# Patient Record
Sex: Female | Born: 1950
Health system: Southern US, Community
[De-identification: ages and names within clinical notes are randomized; demographics above are authoritative.]

## PROBLEM LIST (undated history)

## (undated) ENCOUNTER — Emergency Department (HOSPITAL_COMMUNITY): Admission: EM | Payer: PPO | Source: Home / Self Care

## (undated) DIAGNOSIS — M1990 Unspecified osteoarthritis, unspecified site: Secondary | ICD-10-CM

## (undated) DIAGNOSIS — H9319 Tinnitus, unspecified ear: Secondary | ICD-10-CM

## (undated) DIAGNOSIS — I341 Nonrheumatic mitral (valve) prolapse: Secondary | ICD-10-CM

## (undated) DIAGNOSIS — C55 Malignant neoplasm of uterus, part unspecified: Secondary | ICD-10-CM

## (undated) DIAGNOSIS — N950 Postmenopausal bleeding: Secondary | ICD-10-CM

## (undated) DIAGNOSIS — J189 Pneumonia, unspecified organism: Secondary | ICD-10-CM

## (undated) DIAGNOSIS — H8110 Benign paroxysmal vertigo, unspecified ear: Secondary | ICD-10-CM

## (undated) DIAGNOSIS — I7300 Raynaud's syndrome without gangrene: Secondary | ICD-10-CM

## (undated) DIAGNOSIS — M419 Scoliosis, unspecified: Secondary | ICD-10-CM

## (undated) HISTORY — PX: OTHER SURGICAL HISTORY: SHX169

## (undated) HISTORY — DX: Postmenopausal bleeding: N95.0

## (undated) HISTORY — PX: DILATION AND CURETTAGE OF UTERUS: SHX78

## (undated) HISTORY — DX: Malignant neoplasm of uterus, part unspecified: C55

## (undated) HISTORY — DX: Scoliosis, unspecified: M41.9

## (undated) HISTORY — DX: Benign paroxysmal vertigo, unspecified ear: H81.10

## (undated) HISTORY — DX: Tinnitus, unspecified ear: H93.19

---
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
## 1997-10-04 ENCOUNTER — Ambulatory Visit (HOSPITAL_COMMUNITY): Admission: RE | Admit: 1997-10-04 | Discharge: 1997-10-04 | Payer: Self-pay | Admitting: Obstetrics & Gynecology

## 1998-11-03 ENCOUNTER — Other Ambulatory Visit: Admission: RE | Admit: 1998-11-03 | Discharge: 1998-11-03 | Payer: Self-pay | Admitting: Obstetrics & Gynecology

## 1998-11-10 ENCOUNTER — Encounter: Payer: Self-pay | Admitting: Obstetrics & Gynecology

## 1998-11-10 ENCOUNTER — Ambulatory Visit (HOSPITAL_COMMUNITY): Admission: RE | Admit: 1998-11-10 | Discharge: 1998-11-10 | Payer: Self-pay | Admitting: Obstetrics & Gynecology

## 1998-11-17 ENCOUNTER — Ambulatory Visit (HOSPITAL_COMMUNITY): Admission: RE | Admit: 1998-11-17 | Discharge: 1998-11-17 | Payer: Self-pay | Admitting: Obstetrics & Gynecology

## 1998-11-17 ENCOUNTER — Encounter: Payer: Self-pay | Admitting: Obstetrics & Gynecology

## 1999-11-20 ENCOUNTER — Encounter: Payer: Self-pay | Admitting: Obstetrics & Gynecology

## 1999-11-20 ENCOUNTER — Ambulatory Visit (HOSPITAL_COMMUNITY): Admission: RE | Admit: 1999-11-20 | Discharge: 1999-11-20 | Payer: Self-pay | Admitting: Obstetrics & Gynecology

## 1999-11-21 ENCOUNTER — Other Ambulatory Visit: Admission: RE | Admit: 1999-11-21 | Discharge: 1999-11-21 | Payer: Self-pay | Admitting: Obstetrics & Gynecology

## 1999-11-28 ENCOUNTER — Encounter: Admission: RE | Admit: 1999-11-28 | Discharge: 1999-11-28 | Payer: Self-pay | Admitting: Obstetrics & Gynecology

## 1999-11-28 ENCOUNTER — Encounter: Payer: Self-pay | Admitting: Obstetrics & Gynecology

## 2001-03-07 ENCOUNTER — Encounter: Payer: Self-pay | Admitting: Obstetrics & Gynecology

## 2001-03-07 ENCOUNTER — Ambulatory Visit (HOSPITAL_COMMUNITY): Admission: RE | Admit: 2001-03-07 | Discharge: 2001-03-07 | Payer: Self-pay | Admitting: Obstetrics & Gynecology

## 2001-05-20 ENCOUNTER — Other Ambulatory Visit: Admission: RE | Admit: 2001-05-20 | Discharge: 2001-05-20 | Payer: Self-pay | Admitting: Obstetrics & Gynecology

## 2002-03-31 ENCOUNTER — Encounter: Payer: Self-pay | Admitting: Obstetrics & Gynecology

## 2002-03-31 ENCOUNTER — Ambulatory Visit (HOSPITAL_COMMUNITY): Admission: RE | Admit: 2002-03-31 | Discharge: 2002-03-31 | Payer: Self-pay | Admitting: Obstetrics & Gynecology

## 2002-07-12 ENCOUNTER — Other Ambulatory Visit: Admission: RE | Admit: 2002-07-12 | Discharge: 2002-07-12 | Payer: Self-pay | Admitting: Obstetrics & Gynecology

## 2003-04-06 ENCOUNTER — Ambulatory Visit (HOSPITAL_COMMUNITY): Admission: RE | Admit: 2003-04-06 | Discharge: 2003-04-06 | Payer: Self-pay | Admitting: Obstetrics & Gynecology

## 2003-04-06 ENCOUNTER — Encounter: Payer: Self-pay | Admitting: Obstetrics & Gynecology

## 2003-09-24 ENCOUNTER — Other Ambulatory Visit: Admission: RE | Admit: 2003-09-24 | Discharge: 2003-09-24 | Payer: Self-pay | Admitting: Obstetrics & Gynecology

## 2004-04-11 ENCOUNTER — Ambulatory Visit (HOSPITAL_COMMUNITY): Admission: RE | Admit: 2004-04-11 | Discharge: 2004-04-11 | Payer: Self-pay | Admitting: Obstetrics & Gynecology

## 2005-02-26 ENCOUNTER — Other Ambulatory Visit: Admission: RE | Admit: 2005-02-26 | Discharge: 2005-02-26 | Payer: Self-pay | Admitting: Obstetrics & Gynecology

## 2005-05-21 ENCOUNTER — Ambulatory Visit (HOSPITAL_COMMUNITY): Admission: RE | Admit: 2005-05-21 | Discharge: 2005-05-21 | Payer: Self-pay | Admitting: Obstetrics & Gynecology

## 2006-05-23 ENCOUNTER — Ambulatory Visit (HOSPITAL_COMMUNITY): Admission: RE | Admit: 2006-05-23 | Discharge: 2006-05-23 | Payer: Self-pay | Admitting: Obstetrics & Gynecology

## 2006-06-13 ENCOUNTER — Encounter: Admission: RE | Admit: 2006-06-13 | Discharge: 2006-06-13 | Payer: Self-pay | Admitting: Obstetrics & Gynecology

## 2007-01-18 ENCOUNTER — Encounter: Admission: RE | Admit: 2007-01-18 | Discharge: 2007-01-18 | Payer: Self-pay | Admitting: Gastroenterology

## 2007-06-19 ENCOUNTER — Encounter: Admission: RE | Admit: 2007-06-19 | Discharge: 2007-06-19 | Payer: Self-pay | Admitting: Obstetrics & Gynecology

## 2008-06-22 ENCOUNTER — Encounter: Admission: RE | Admit: 2008-06-22 | Discharge: 2008-06-22 | Payer: Self-pay | Admitting: Obstetrics & Gynecology

## 2009-06-24 ENCOUNTER — Encounter: Admission: RE | Admit: 2009-06-24 | Discharge: 2009-06-24 | Payer: Self-pay | Admitting: Obstetrics & Gynecology

## 2010-07-11 ENCOUNTER — Encounter: Admission: RE | Admit: 2010-07-11 | Discharge: 2010-07-11 | Payer: Self-pay | Admitting: Obstetrics & Gynecology

## 2010-08-27 ENCOUNTER — Encounter: Payer: Self-pay | Admitting: Obstetrics & Gynecology

## 2011-06-15 ENCOUNTER — Other Ambulatory Visit: Payer: Self-pay | Admitting: Obstetrics & Gynecology

## 2011-06-15 DIAGNOSIS — Z1231 Encounter for screening mammogram for malignant neoplasm of breast: Secondary | ICD-10-CM

## 2011-07-17 ENCOUNTER — Ambulatory Visit
Admission: RE | Admit: 2011-07-17 | Discharge: 2011-07-17 | Disposition: A | Discharge status: Home / Self Care | Payer: Managed Care, Other (non HMO) | Source: Ambulatory Visit | Attending: Obstetrics & Gynecology | Admitting: Obstetrics & Gynecology

## 2011-07-17 DIAGNOSIS — Z1231 Encounter for screening mammogram for malignant neoplasm of breast: Secondary | ICD-10-CM

## 2012-06-16 ENCOUNTER — Other Ambulatory Visit: Payer: Self-pay | Admitting: Obstetrics & Gynecology

## 2012-06-16 DIAGNOSIS — Z1231 Encounter for screening mammogram for malignant neoplasm of breast: Secondary | ICD-10-CM

## 2012-07-17 ENCOUNTER — Ambulatory Visit: Payer: Managed Care, Other (non HMO)

## 2012-08-07 ENCOUNTER — Ambulatory Visit: Payer: Managed Care, Other (non HMO)

## 2012-08-25 ENCOUNTER — Ambulatory Visit
Admission: RE | Admit: 2012-08-25 | Discharge: 2012-08-25 | Disposition: A | Discharge status: Home / Self Care | Payer: Managed Care, Other (non HMO) | Source: Ambulatory Visit | Attending: Obstetrics & Gynecology | Admitting: Obstetrics & Gynecology

## 2012-08-25 DIAGNOSIS — Z1231 Encounter for screening mammogram for malignant neoplasm of breast: Secondary | ICD-10-CM

## 2013-07-24 ENCOUNTER — Other Ambulatory Visit: Payer: Self-pay

## 2013-07-24 DIAGNOSIS — Z1231 Encounter for screening mammogram for malignant neoplasm of breast: Secondary | ICD-10-CM

## 2013-08-26 ENCOUNTER — Ambulatory Visit: Payer: Managed Care, Other (non HMO)

## 2013-09-16 ENCOUNTER — Ambulatory Visit
Admission: RE | Admit: 2013-09-16 | Discharge: 2013-09-16 | Disposition: A | Discharge status: Home / Self Care | Payer: Managed Care, Other (non HMO) | Source: Ambulatory Visit

## 2013-09-16 DIAGNOSIS — Z1231 Encounter for screening mammogram for malignant neoplasm of breast: Secondary | ICD-10-CM

## 2014-08-30 ENCOUNTER — Other Ambulatory Visit: Payer: Self-pay

## 2014-08-30 DIAGNOSIS — Z1231 Encounter for screening mammogram for malignant neoplasm of breast: Secondary | ICD-10-CM

## 2014-09-17 ENCOUNTER — Ambulatory Visit
Admission: RE | Admit: 2014-09-17 | Discharge: 2014-09-17 | Disposition: A | Discharge status: Home / Self Care | Payer: Managed Care, Other (non HMO) | Source: Ambulatory Visit

## 2014-09-17 DIAGNOSIS — Z1231 Encounter for screening mammogram for malignant neoplasm of breast: Secondary | ICD-10-CM

## 2015-08-23 ENCOUNTER — Other Ambulatory Visit: Payer: Self-pay

## 2015-08-23 DIAGNOSIS — Z1231 Encounter for screening mammogram for malignant neoplasm of breast: Secondary | ICD-10-CM

## 2015-09-19 ENCOUNTER — Ambulatory Visit
Admission: RE | Admit: 2015-09-19 | Discharge: 2015-09-19 | Disposition: A | Discharge status: Home / Self Care | Payer: Managed Care, Other (non HMO) | Source: Ambulatory Visit

## 2015-09-19 DIAGNOSIS — Z1231 Encounter for screening mammogram for malignant neoplasm of breast: Secondary | ICD-10-CM

## 2016-08-20 ENCOUNTER — Other Ambulatory Visit: Payer: Self-pay | Admitting: Obstetrics & Gynecology

## 2016-08-20 DIAGNOSIS — Z1231 Encounter for screening mammogram for malignant neoplasm of breast: Secondary | ICD-10-CM

## 2016-09-21 ENCOUNTER — Ambulatory Visit: Payer: Managed Care, Other (non HMO)

## 2016-10-03 ENCOUNTER — Ambulatory Visit
Admission: RE | Admit: 2016-10-03 | Discharge: 2016-10-03 | Disposition: A | Discharge status: Home / Self Care | Payer: BLUE CROSS/BLUE SHIELD | Source: Ambulatory Visit | Attending: Obstetrics & Gynecology | Admitting: Obstetrics & Gynecology

## 2016-10-03 DIAGNOSIS — Z1231 Encounter for screening mammogram for malignant neoplasm of breast: Secondary | ICD-10-CM

## 2017-07-10 ENCOUNTER — Telehealth: Payer: Self-pay | Admitting: *Deleted

## 2017-07-10 NOTE — Telephone Encounter (Signed)
Called and spoke with the patient, gave the new patient appt for December 12th at 10:45am

## 2017-07-17 ENCOUNTER — Ambulatory Visit: Payer: BLUE CROSS/BLUE SHIELD | Attending: Gynecologic Oncology | Admitting: Gynecologic Oncology

## 2017-07-17 ENCOUNTER — Encounter: Payer: Self-pay | Admitting: Gynecologic Oncology

## 2017-07-17 VITALS — BP 136/64 | HR 74 | Temp 97.4°F | Resp 18 | Ht 64.5 in | Wt 134.2 lb

## 2017-07-17 DIAGNOSIS — C541 Malignant neoplasm of endometrium: Secondary | ICD-10-CM | POA: Insufficient documentation

## 2017-07-17 DIAGNOSIS — Z981 Arthrodesis status: Secondary | ICD-10-CM | POA: Diagnosis not present

## 2017-07-17 DIAGNOSIS — E559 Vitamin D deficiency, unspecified: Secondary | ICD-10-CM | POA: Insufficient documentation

## 2017-07-17 DIAGNOSIS — M419 Scoliosis, unspecified: Secondary | ICD-10-CM | POA: Diagnosis not present
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
## 2017-07-17 NOTE — Patient Instructions (Signed)
Preparing for your Surgery  Plan for surgery on August 15, 2017 with Dr. Janie Morning at [place] will be scheduled for a robotic assisted total hysterectomy, bilateral salpingo-oophorectomy, sentinel lymph node biopsy.  Stop taking your hormones (estrogen patch, progesterone) now.  Pre-operative Testing -You will receive a phone call from presurgical testing at [facility] to arrange for a pre-operative testing appointment before your surgery.  This appointment normally occurs one to two weeks before your scheduled surgery.   -Bring your insurance card, copy of an advanced directive if applicable, medication list  -At that visit, you will be asked to sign a consent for a possible blood transfusion in case a transfusion becomes necessary during surgery.  The need for a blood transfusion is rare but having consent is a necessary part of your care.    -You should not be taking blood thinners or aspirin at least ten days prior to surgery unless instructed by your surgeon.  Day Before Surgery at [place] will be asked to take in a light diet the day before surgery.  Avoid carbonated beverages.  You will be advised to have nothing to eat or drink after midnight the evening before.    Eat a light diet the day before surgery.  Examples including soups, broths, toast, yogurt, mashed potatoes.  Things to avoid include carbonated beverages (fizzy beverages), raw fruits and raw vegetables, or beans.   If your bowels are filled with gas, your surgeon will have difficulty visualizing your pelvic organs which increases your surgical risks.  Your role in recovery Your role is to become active as soon as directed by your doctor, while still giving yourself time to heal.  Rest when you feel tired. You will be asked to do the following in order to speed your recovery:  - Cough and breathe deeply. This helps toclear and expand your lungs and can prevent pneumonia.  You may be given a spirometer to practice deep breathing. A staff member will show you how to use the spirometer. - Do mild physical activity. Walking or moving your legs help your circulation and body functions return to normal. A staff member will help you when you try to walk and will provide you with simple exercises. Do not try to get up or walk alone the first time. - Actively manage your pain. Managing your pain lets you move in comfort. We will ask you to rate your pain on a scale of zero to 10. It is your responsibility to tell your doctor or nurse where and how much you hurt so your pain can be treated.  Special Considerations -If you are diabetic, you may be placed on insulin after surgery to have closer control over your blood sugars to promote healing and recovery.  This does not mean that you will be discharged on insulin.  If applicable, your oral antidiabetics will be resumed when you are tolerating a solid diet.  -Your final pathology results from surgery should be available by the Friday after surgery and the results will be relayed to you when available.  -Dr. Lahoma Crocker is the Surgeon that assists your GYN Oncologist with surgery.  The next day after your surgery you will either see your GYN Oncologist or Dr. Lahoma Crocker.   Blood Transfusion Information WHAT IS A BLOOD TRANSFUSION? A transfusion is the replacement of blood or some of its parts. Blood is made up of multiple cells which provide different functions.  Red blood  cells carry oxygen and are used for blood loss replacement.  White blood cells fight against infection.  Platelets control bleeding.  Plasma helps clot blood.  Other blood products are available for specialized needs, such as hemophilia or other clotting disorders. BEFORE THE TRANSFUSION  Who gives blood for transfusions?   You may be able to donate blood to be used at a later date on yourself (autologous donation).  Relatives can  be asked to donate blood. This is generally not any safer than if you have received blood from a stranger. The same precautions are taken to ensure safety when a relative's blood is donated.  Healthy volunteers who are fully evaluated to make sure their blood is safe. This is blood bank blood. Transfusion therapy is the safest it has ever been in the practice of medicine. Before blood is taken from a donor, a complete history is taken to make sure that person has no history of diseases nor engages in risky social behavior (examples are intravenous drug use or sexual activity with multiple partners). The donor's travel history is screened to minimize risk of transmitting infections, such as malaria. The donated blood is tested for signs of infectious diseases, such as HIV and hepatitis. The blood is then tested to be sure it is compatible with you in order to minimize the chance of a transfusion reaction. If you or a relative donates blood, this is often done in anticipation of surgery and is not appropriate for emergency situations. It takes many days to process the donated blood. RISKS AND COMPLICATIONS Although transfusion therapy is very safe and saves many lives, the main dangers of transfusion include:   Getting an infectious disease.  Developing a transfusion reaction. This is an allergic reaction to something in the blood you were given. Every precaution is taken to prevent this. The decision to have a blood transfusion has been considered carefully by your caregiver before blood is given. Blood is not given unless the benefits outweigh the risks.

## 2017-07-19 ENCOUNTER — Telehealth: Payer: Self-pay | Admitting: Gynecologic Oncology

## 2017-07-19 NOTE — Telephone Encounter (Signed)
Returned call to patient.  Patient stated she spoke with her GYN who recommended she have surgery with Dr. Alycia Rossetti or Dr. Denman George.  She wants to be removed from Jan 10 with Dr. Skeet Latch.  Informed patient of options for Jan 8 at [facility] with Dr. Alycia Rossetti or Jan 22 with Dr. Denman George in [place].  She is going to discuss with her husband and call the office back with her decision.

## 2017-07-22 ENCOUNTER — Telehealth: Payer: Self-pay | Admitting: Gynecologic Oncology

## 2017-07-22 NOTE — Telephone Encounter (Signed)
Returned call to patient.  Patient stating she would like to proceed with surgery with Dr. Denman George on Jan 22.  All questions answered.  Advised to call for any needs.

## 2017-08-08 ENCOUNTER — Telehealth: Payer: Self-pay | Admitting: Gynecologic Oncology

## 2017-08-08 NOTE — Telephone Encounter (Signed)
Returned call to patient.  Patient stating she has been having loose stools in the am and she feels it is related to her nerves.  Advised to follow up with her primary care about this issue in case a sample needs to be given for further testing.  All questions answered related to upcoming surgery.  Advised patient that she could make an appt to meet with Dr. Denman George in the office prior to surgery if she would like and she said she would contact the office if she would like to do that.  Advised to call for any questions or concerns.

## 2017-08-14 ENCOUNTER — Telehealth: Payer: Self-pay | Admitting: Gynecologic Oncology

## 2017-08-14 NOTE — Telephone Encounter (Signed)
Returned call to patient.  All questions answered relating to surgery including sentinel lymph node biopsy and possible lymphadenectomy.  Advised to call for any questions or concerns.

## 2017-08-16 NOTE — Patient Instructions (Signed)
Stephanie Mahoney  08/16/2017   Your procedure is scheduled on: 08/27/17   Report to [facility] Main  Entrance  Report to admitting at   0900 AM   Call this number if you have problems the morning of surgery [phone]   Remember: Do not eat food or drink liquids :After Midnight. Eat a light diet the day before surgery.  Examples include: soups, toast, broth, yogurt and mashed potatoes.  Things to avoid include carbonated beverages, raw fruits and vegetables and beans.      Take these medicines the morning of surgery with A SIP OF WATER: none                                 You may not have any metal on your body including hair pins and              piercings  Do not wear jewelry, make-up, lotions, powders or perfumes, deodorant             Do not wear nail polish.  Do not shave  48 hours prior to surgery.                Do not bring valuables to the hospital. [place].  Contacts, dentures or bridgework may not be worn into surgery.  Leave suitcase in the car. After surgery it may be brought to your room.     Patients discharged the day of surgery will not be allowed to drive home.  Name and phone number of your driver:  Special Instructions: coughing and deep breathing exercises, leg exercises               Please read over the following fact sheets you were given: _____________________________________________________________________             [facility] - Preparing for Surgery Before surgery, you can play an important role.  Because skin is not sterile, your skin needs to be as free of germs as possible.  You can reduce the number of germs on your skin by washing with CHG (chlorahexidine gluconate) soap before surgery.  CHG is an antiseptic cleaner which kills germs and bonds with the skin to continue killing germs even after washing. Please DO NOT use if you have an allergy to CHG or antibacterial  soaps.  If your skin becomes reddened/irritated stop using the CHG and inform your nurse when you arrive at Short Stay. Do not shave (including legs and underarms) for at least 48 hours prior to the first CHG shower.  You may shave your face/neck. Please follow these instructions carefully:  1.  Shower with CHG Soap the night before surgery and the  morning of Surgery.  2.  If you choose to wash your hair, wash your hair first as usual with your  normal  shampoo.  3.  After you shampoo, rinse your hair and body thoroughly to remove the  shampoo.                           4.  Use CHG as you would any other liquid soap.  You can apply chg directly  to the skin and wash  Gently with a scrungie or clean washcloth.  5.  Apply the CHG Soap to your body ONLY FROM THE NECK DOWN.   Do not use on face/ open                           Wound or open sores. Avoid contact with eyes, ears mouth and genitals (private parts).                       Wash face,  Genitals (private parts) with your normal soap.             6.  Wash thoroughly, paying special attention to the area where your surgery  will be performed.  7.  Thoroughly rinse your body with warm water from the neck down.  8.  DO NOT shower/wash with your normal soap after using and rinsing off  the CHG Soap.                9.  Pat yourself dry with a clean towel.            10.  Wear clean pajamas.            11.  Place clean sheets on your bed the night of your first shower and do not  sleep with pets. Day of Surgery : Do not apply any lotions/deodorants the morning of surgery.  Please wear clean clothes to the hospital/surgery center.  FAILURE TO FOLLOW THESE INSTRUCTIONS MAY RESULT IN THE CANCELLATION OF YOUR SURGERY PATIENT SIGNATURE_________________________________  NURSE SIGNATURE__________________________________  ________________________________________________________________________  WHAT IS A BLOOD TRANSFUSION? Blood  Transfusion Information  A transfusion is the replacement of blood or some of its parts. Blood is made up of multiple cells which provide different functions.  Red blood cells carry oxygen and are used for blood loss replacement.  White blood cells fight against infection.  Platelets control bleeding.  Plasma helps clot blood.  Other blood products are available for specialized needs, such as hemophilia or other clotting disorders. BEFORE THE TRANSFUSION  Who gives blood for transfusions?   Healthy volunteers who are fully evaluated to make sure their blood is safe. This is blood bank blood. Transfusion therapy is the safest it has ever been in the practice of medicine. Before blood is taken from a donor, a complete history is taken to make sure that person has no history of diseases nor engages in risky social behavior (examples are intravenous drug use or sexual activity with multiple partners). The donor's travel history is screened to minimize risk of transmitting infections, such as malaria. The donated blood is tested for signs of infectious diseases, such as HIV and hepatitis. The blood is then tested to be sure it is compatible with you in order to minimize the chance of a transfusion reaction. If you or a relative donates blood, this is often done in anticipation of surgery and is not appropriate for emergency situations. It takes many days to process the donated blood. RISKS AND COMPLICATIONS Although transfusion therapy is very safe and saves many lives, the main dangers of transfusion include:   Getting an infectious disease.  Developing a transfusion reaction. This is an allergic reaction to something in the blood you were given. Every precaution is taken to prevent this. The decision to have a blood transfusion has been considered carefully by your caregiver before blood is given. Blood is not given unless the benefits outweigh  the risks. AFTER THE TRANSFUSION  Right after  receiving a blood transfusion, you will usually feel much better and more energetic. This is especially true if your red blood cells have gotten low (anemic). The transfusion raises the level of the red blood cells which carry oxygen, and this usually causes an energy increase.  The nurse administering the transfusion will monitor you carefully for complications. HOME CARE INSTRUCTIONS  No special instructions are needed after a transfusion. You may find your energy is better. Speak with your caregiver about any limitations on activity for underlying diseases you may have. SEEK MEDICAL CARE IF:   Your condition is not improving after your transfusion.  You develop redness or irritation at the intravenous (IV) site. SEEK IMMEDIATE MEDICAL CARE IF:  Any of the following symptoms occur over the next 12 hours:  Shaking chills.  You have a temperature by mouth above 102 F (38.9 C), not controlled by medicine.  Chest, back, or muscle pain.  People around you feel you are not acting correctly or are confused.  Shortness of breath or difficulty breathing.  Dizziness and fainting.  You get a rash or develop hives.  You have a decrease in urine output.  Your urine turns a dark color or changes to pink, red, or brown. Any of the following symptoms occur over the next 10 days:  You have a temperature by mouth above 102 F (38.9 C), not controlled by medicine.  Shortness of breath.  Weakness after normal activity.  The white part of the eye turns yellow (jaundice).  You have a decrease in the amount of urine or are urinating less often.  Your urine turns a dark color or changes to pink, red, or brown. Document Released: 07/20/2000 Document Revised: 10/15/2011 Document Reviewed: 03/08/2008 ExitCare Patient Information 2014 [place].  _______________________________________________________________________  Incentive Spirometer  An incentive spirometer is a tool that can  help keep your lungs clear and active. This tool measures how well you are filling your lungs with each breath. Taking long deep breaths may help reverse or decrease the chance of developing breathing (pulmonary) problems (especially infection) following:  A long period of time when you are unable to move or be active. BEFORE THE PROCEDURE   If the spirometer includes an indicator to show your best effort, your nurse or respiratory therapist will set it to a desired goal.  If possible, sit up straight or lean slightly forward. Try not to slouch.  Hold the incentive spirometer in an upright position. INSTRUCTIONS FOR USE  1. Sit on the edge of your bed if possible, or sit up as far as you can in bed or on a chair. 2. Hold the incentive spirometer in an upright position. 3. Breathe out normally. 4. Place the mouthpiece in your mouth and seal your lips tightly around it. 5. Breathe in slowly and as deeply as possible, raising the piston or the ball toward the top of the column. 6. Hold your breath for 3-5 seconds or for as long as possible. Allow the piston or ball to fall to the bottom of the column. 7. Remove the mouthpiece from your mouth and breathe out normally. 8. Rest for a few seconds and repeat Steps 1 through 7 at least 10 times every 1-2 hours when you are awake. Take your time and take a few normal breaths between deep breaths. 9. The spirometer may include an indicator to show your best effort. Use the indicator as a goal to work  toward during each repetition. 10. After each set of 10 deep breaths, practice coughing to be sure your lungs are clear. If you have an incision (the cut made at the time of surgery), support your incision when coughing by placing a pillow or rolled up towels firmly against it. Once you are able to get out of bed, walk around indoors and cough well. You may stop using the incentive spirometer when instructed by your caregiver.  RISKS AND COMPLICATIONS  Take  your time so you do not get dizzy or light-headed.  If you are in pain, you may need to take or ask for pain medication before doing incentive spirometry. It is harder to take a deep breath if you are having pain. AFTER USE  Rest and breathe slowly and easily.  It can be helpful to keep track of a log of your progress. Your caregiver can provide you with a simple table to help with this. If you are using the spirometer at home, follow these instructions: [place] IF:   You are having difficultly using the spirometer.  You have trouble using the spirometer as often as instructed.  Your pain medication is not giving enough relief while using the spirometer.  You develop fever of 100.5 F (38.1 C) or higher. SEEK IMMEDIATE MEDICAL CARE IF:   You cough up bloody sputum that had not been present before.  You develop fever of 102 F (38.9 C) or greater.  You develop worsening pain at or near the incision site. MAKE SURE YOU:   Understand these instructions.  Will watch your condition.  Will get help right away if you are not doing well or get worse. Document Released: 12/03/2006 Document Revised: 10/15/2011 Document Reviewed: 02/03/2007 [facility] Patient Information 2014 [place], [place].   ________________________________________________________________________

## 2017-08-20 ENCOUNTER — Encounter (HOSPITAL_COMMUNITY): Payer: Self-pay

## 2017-08-20 ENCOUNTER — Encounter (HOSPITAL_COMMUNITY)
Admission: RE | Admit: 2017-08-20 | Discharge: 2017-08-20 | Disposition: A | Discharge status: Home / Self Care | Payer: BLUE CROSS/BLUE SHIELD | Source: Ambulatory Visit | Attending: Gynecologic Oncology | Admitting: Gynecologic Oncology

## 2017-08-20 ENCOUNTER — Other Ambulatory Visit: Payer: Self-pay

## 2017-08-20 DIAGNOSIS — Z01812 Encounter for preprocedural laboratory examination: Secondary | ICD-10-CM | POA: Insufficient documentation

## 2017-08-20 HISTORY — DX: Unspecified osteoarthritis, unspecified site: M19.90

## 2017-08-20 HISTORY — DX: Raynaud's syndrome without gangrene: I73.00

## 2017-08-20 HISTORY — DX: Nonrheumatic mitral (valve) prolapse: I34.1

## 2017-08-20 HISTORY — DX: Pneumonia, unspecified organism: J18.9

## 2017-08-20 LAB — COMPREHENSIVE METABOLIC PANEL
ALK PHOS: 48 U/L (ref 38–126)
ALT: 20 U/L (ref 14–54)
ANION GAP: 5 (ref 5–15)
AST: 23 U/L (ref 15–41)
Albumin: 4.3 g/dL (ref 3.5–5.0)
BUN: 18 mg/dL (ref 6–20)
CHLORIDE: 106 mmol/L (ref 101–111)
CO2: 29 mmol/L (ref 22–32)
Calcium: 9.6 mg/dL (ref 8.9–10.3)
Creatinine, Ser: 0.59 mg/dL (ref 0.44–1.00)
GFR calc Af Amer: >60 mL/min (ref >60)
GFR calc non Af Amer: >60 mL/min (ref >60)
GLUCOSE: 95 mg/dL (ref 65–99)
POTASSIUM: 4.4 mmol/L (ref 3.5–5.1)
SODIUM: 140 mmol/L (ref 135–145)
Total Bilirubin: 1.1 mg/dL (ref 0.3–1.2)
Total Protein: 7.9 g/dL (ref 6.5–8.1)

## 2017-08-20 LAB — URINALYSIS, ROUTINE W REFLEX MICROSCOPIC
Bilirubin Urine: NEGATIVE
GLUCOSE, UA: NEGATIVE mg/dL
Hgb urine dipstick: NEGATIVE
Ketones, ur: NEGATIVE mg/dL
NITRITE: NEGATIVE
PH: 7 (ref 5.0–8.0)
Protein, ur: NEGATIVE mg/dL
SPECIFIC GRAVITY, URINE: 1.008 (ref 1.005–1.030)

## 2017-08-20 LAB — CBC
HCT: 42.3 % (ref 36.0–46.0)
HEMOGLOBIN: 14.3 g/dL (ref 12.0–15.0)
MCH: 29.7 pg (ref 26.0–34.0)
MCHC: 33.8 g/dL (ref 30.0–36.0)
MCV: 87.9 fL (ref 78.0–100.0)
PLATELETS: 240 10*3/uL (ref 150–400)
RBC: 4.81 MIL/uL (ref 3.87–5.11)
RDW: 13.1 % (ref 11.5–15.5)
WBC: 6.8 10*3/uL (ref 4.0–10.5)

## 2017-08-20 LAB — ABO/RH: ABO/RH(D): A POS

## 2017-08-20 NOTE — Progress Notes (Signed)
U/A done 08/20/17 routed via epic to Dr Denman George and Zoila Shutter.

## 2017-08-20 NOTE — Progress Notes (Signed)
Patient came in for preop on 08/20/17 and stated that surgeon had been changed from Dr Skeet Latch to Dr Denman George.  Called office of OB/GYN Oncology and Melissa Cross,Np placed new consent order which patient signed and placed on chart.

## 2017-08-23 ENCOUNTER — Telehealth: Payer: Self-pay | Admitting: *Deleted

## 2017-08-23 NOTE — Telephone Encounter (Signed)
Patient called and wanted to make sure the Dr. Denman George was going to be preforming her procedure on February 20th. Advised the patient that Dr. Denman George will be her surgeon. Also the patient asked if she can take something to help her sleep the night before her surgery. Advised the patient per Melissa APP she can take something, but will need to advise the anesthesiologist the morning of the surgery what she took.

## 2017-08-27 ENCOUNTER — Encounter (HOSPITAL_COMMUNITY): Payer: Self-pay | Admitting: Emergency Medicine

## 2017-08-27 ENCOUNTER — Ambulatory Visit (HOSPITAL_COMMUNITY): Payer: BLUE CROSS/BLUE SHIELD | Admitting: Certified Registered Nurse Anesthetist

## 2017-08-27 ENCOUNTER — Encounter (HOSPITAL_COMMUNITY)
Admission: RE | Disposition: A | Discharge status: Home / Self Care | Payer: Self-pay | Source: Ambulatory Visit | Attending: Gynecologic Oncology

## 2017-08-27 ENCOUNTER — Other Ambulatory Visit: Payer: Self-pay

## 2017-08-27 ENCOUNTER — Ambulatory Visit (HOSPITAL_COMMUNITY)
Admission: RE | Admit: 2017-08-27 | Discharge: 2017-08-28 | Disposition: A | Discharge status: Home / Self Care | Payer: BLUE CROSS/BLUE SHIELD | Source: Ambulatory Visit | Attending: Gynecologic Oncology | Admitting: Gynecologic Oncology

## 2017-08-27 DIAGNOSIS — Z79899 Other long term (current) drug therapy: Secondary | ICD-10-CM | POA: Diagnosis not present

## 2017-08-27 DIAGNOSIS — Z981 Arthrodesis status: Secondary | ICD-10-CM | POA: Insufficient documentation

## 2017-08-27 DIAGNOSIS — N838 Other noninflammatory disorders of ovary, fallopian tube and broad ligament: Secondary | ICD-10-CM | POA: Insufficient documentation

## 2017-08-27 DIAGNOSIS — Z8249 Family history of ischemic heart disease and other diseases of the circulatory system: Secondary | ICD-10-CM | POA: Diagnosis not present

## 2017-08-27 DIAGNOSIS — N83201 Unspecified ovarian cyst, right side: Secondary | ICD-10-CM | POA: Insufficient documentation

## 2017-08-27 DIAGNOSIS — Z91048 Other nonmedicinal substance allergy status: Secondary | ICD-10-CM | POA: Diagnosis not present

## 2017-08-27 DIAGNOSIS — Z823 Family history of stroke: Secondary | ICD-10-CM | POA: Insufficient documentation

## 2017-08-27 DIAGNOSIS — M419 Scoliosis, unspecified: Secondary | ICD-10-CM | POA: Insufficient documentation

## 2017-08-27 DIAGNOSIS — Z8262 Family history of osteoporosis: Secondary | ICD-10-CM | POA: Diagnosis not present

## 2017-08-27 DIAGNOSIS — Z9889 Other specified postprocedural states: Secondary | ICD-10-CM | POA: Diagnosis not present

## 2017-08-27 DIAGNOSIS — N83202 Unspecified ovarian cyst, left side: Secondary | ICD-10-CM | POA: Insufficient documentation

## 2017-08-27 DIAGNOSIS — C541 Malignant neoplasm of endometrium: Secondary | ICD-10-CM

## 2017-08-27 DIAGNOSIS — Z79890 Hormone replacement therapy (postmenopausal): Secondary | ICD-10-CM | POA: Insufficient documentation

## 2017-08-27 DIAGNOSIS — Z8269 Family history of other diseases of the musculoskeletal system and connective tissue: Secondary | ICD-10-CM | POA: Insufficient documentation

## 2017-08-27 HISTORY — PX: LYMPH NODE BIOPSY: SHX201

## 2017-08-27 HISTORY — PX: ROBOTIC ASSISTED TOTAL HYSTERECTOMY WITH BILATERAL SALPINGO OOPHERECTOMY: SHX6086

## 2017-08-27 LAB — TYPE AND SCREEN
ABO/RH(D): A POS
ANTIBODY SCREEN: NEGATIVE

## 2017-08-27 SURGERY — HYSTERECTOMY, TOTAL, ROBOT-ASSISTED, LAPAROSCOPIC, WITH BILATERAL SALPINGO-OOPHORECTOMY
Anesthesia: General | Laterality: Bilateral

## 2017-08-27 MED ORDER — FENTANYL CITRATE (PF) 100 MCG/2ML IJ SOLN
INTRAMUSCULAR | Status: DC | PRN
  Administered 2017-08-27 (×5): 50 ug via INTRAVENOUS

## 2017-08-27 MED ORDER — LIDOCAINE 2% (20 MG/ML) 5 ML SYRINGE
INTRAMUSCULAR | Status: DC | PRN
  Administered 2017-08-27: 100 mg via INTRAVENOUS

## 2017-08-27 MED ORDER — SUGAMMADEX SODIUM 200 MG/2ML IV SOLN
INTRAVENOUS | Status: AC
  Filled 2017-08-27: qty 2

## 2017-08-27 MED ORDER — LACTATED RINGERS IV SOLN
INTRAVENOUS | Status: DC
  Administered 2017-08-27 (×2): via INTRAVENOUS

## 2017-08-27 MED ORDER — OXYCODONE HCL 5 MG PO TABS
5.0000 mg | ORAL_TABLET | ORAL | Status: DC | PRN
  Administered 2017-08-27: 5 mg via ORAL
  Filled 2017-08-27: qty 1

## 2017-08-27 MED ORDER — ROCURONIUM BROMIDE 50 MG/5ML IV SOSY
PREFILLED_SYRINGE | INTRAVENOUS | Status: DC | PRN
  Administered 2017-08-27: 10 mg via INTRAVENOUS
  Administered 2017-08-27: 40 mg via INTRAVENOUS

## 2017-08-27 MED ORDER — ROCURONIUM BROMIDE 50 MG/5ML IV SOSY
PREFILLED_SYRINGE | INTRAVENOUS | Status: AC
  Filled 2017-08-27: qty 5

## 2017-08-27 MED ORDER — CEFAZOLIN SODIUM-DEXTROSE 2-4 GM/100ML-% IV SOLN
2.0000 g | INTRAVENOUS | Status: AC
  Administered 2017-08-27: 2 g via INTRAVENOUS
  Filled 2017-08-27: qty 100

## 2017-08-27 MED ORDER — KETAMINE HCL 10 MG/ML IJ SOLN
INTRAMUSCULAR | Status: DC | PRN
  Administered 2017-08-27: 30 mg via INTRAVENOUS

## 2017-08-27 MED ORDER — ONDANSETRON HCL 4 MG/2ML IJ SOLN
INTRAMUSCULAR | Status: AC
Start: 2017-08-27 — End: ?
  Filled 2017-08-27: qty 2

## 2017-08-27 MED ORDER — LIDOCAINE 2% (20 MG/ML) 5 ML SYRINGE
INTRAMUSCULAR | Status: DC | PRN
  Administered 2017-08-27: 1.5 mg/kg/h via INTRAVENOUS

## 2017-08-27 MED ORDER — KCL IN DEXTROSE-NACL 20-5-0.45 MEQ/L-%-% IV SOLN
INTRAVENOUS | Status: DC
  Administered 2017-08-27: 16:00:00 via INTRAVENOUS
  Filled 2017-08-27 (×2): qty 1000

## 2017-08-27 MED ORDER — STERILE WATER FOR INJECTION IJ SOLN
INTRAMUSCULAR | Status: AC
  Filled 2017-08-27: qty 10

## 2017-08-27 MED ORDER — FENTANYL CITRATE (PF) 250 MCG/5ML IJ SOLN
INTRAMUSCULAR | Status: AC
  Filled 2017-08-27: qty 5

## 2017-08-27 MED ORDER — ONDANSETRON HCL 4 MG PO TABS: 4.0000 mg | ORAL_TABLET | Freq: Four times a day (QID) | ORAL | Status: DC | PRN

## 2017-08-27 MED ORDER — ACETAMINOPHEN 10 MG/ML IV SOLN: 1000.0000 mg | Freq: Once | INTRAVENOUS | Status: DC | PRN

## 2017-08-27 MED ORDER — ONDANSETRON HCL 4 MG/2ML IJ SOLN: 4.0000 mg | Freq: Four times a day (QID) | INTRAMUSCULAR | Status: DC | PRN

## 2017-08-27 MED ORDER — LACTATED RINGERS IR SOLN
Status: DC | PRN
  Administered 2017-08-27: 1000 mL

## 2017-08-27 MED ORDER — DEXAMETHASONE SODIUM PHOSPHATE 10 MG/ML IJ SOLN
INTRAMUSCULAR | Status: AC
  Filled 2017-08-27: qty 1

## 2017-08-27 MED ORDER — DEXAMETHASONE SODIUM PHOSPHATE 4 MG/ML IJ SOLN
4.0000 mg | INTRAMUSCULAR | Status: AC
  Administered 2017-08-27: 4 mg via INTRAVENOUS

## 2017-08-27 MED ORDER — SUGAMMADEX SODIUM 200 MG/2ML IV SOLN
INTRAVENOUS | Status: DC | PRN
  Administered 2017-08-27: 120 mg via INTRAVENOUS

## 2017-08-27 MED ORDER — ENOXAPARIN SODIUM 40 MG/0.4ML ~~LOC~~ SOLN
40.0000 mg | SUBCUTANEOUS | Status: AC
  Administered 2017-08-27: 40 mg via SUBCUTANEOUS
  Filled 2017-08-27: qty 0.4

## 2017-08-27 MED ORDER — INDOCYANINE GREEN 25 MG IV SOLR
INTRAVENOUS | Status: DC | PRN
  Administered 2017-08-27: 2.5 mg

## 2017-08-27 MED ORDER — LIDOCAINE 2% (20 MG/ML) 5 ML SYRINGE
INTRAMUSCULAR | Status: AC
  Filled 2017-08-27: qty 5

## 2017-08-27 MED ORDER — PROPOFOL 10 MG/ML IV BOLUS
INTRAVENOUS | Status: DC | PRN
  Administered 2017-08-27: 120 mg via INTRAVENOUS

## 2017-08-27 MED ORDER — HYDROCODONE-ACETAMINOPHEN 7.5-325 MG PO TABS: 1.0000 | ORAL_TABLET | Freq: Once | ORAL | Status: DC | PRN

## 2017-08-27 MED ORDER — LIDOCAINE HCL 2 % IJ SOLN
INTRAMUSCULAR | Status: AC
  Filled 2017-08-27: qty 20

## 2017-08-27 MED ORDER — CELECOXIB 200 MG PO CAPS
400.0000 mg | ORAL_CAPSULE | ORAL | Status: AC
  Administered 2017-08-27: 400 mg via ORAL
  Filled 2017-08-27: qty 2

## 2017-08-27 MED ORDER — HYDROMORPHONE HCL 1 MG/ML IJ SOLN
0.2500 mg | INTRAMUSCULAR | Status: DC | PRN
  Administered 2017-08-27 (×2): 0.5 mg via INTRAVENOUS

## 2017-08-27 MED ORDER — ACETAMINOPHEN 500 MG PO TABS
1000.0000 mg | ORAL_TABLET | ORAL | Status: AC
  Administered 2017-08-27: 1000 mg via ORAL
  Filled 2017-08-27: qty 2

## 2017-08-27 MED ORDER — GABAPENTIN 300 MG PO CAPS
300.0000 mg | ORAL_CAPSULE | ORAL | Status: AC
  Administered 2017-08-27: 300 mg via ORAL
  Filled 2017-08-27: qty 1

## 2017-08-27 MED ORDER — PROMETHAZINE HCL 25 MG/ML IJ SOLN
6.2500 mg | INTRAMUSCULAR | Status: DC | PRN
Start: 2017-08-27 — End: 2017-08-27

## 2017-08-27 MED ORDER — HYDROMORPHONE HCL 1 MG/ML IJ SOLN: 0.2000 mg | INTRAMUSCULAR | Status: DC | PRN

## 2017-08-27 MED ORDER — HYDROMORPHONE HCL 1 MG/ML IJ SOLN
INTRAMUSCULAR | Status: AC
  Filled 2017-08-27: qty 2

## 2017-08-27 MED ORDER — ACETAMINOPHEN 500 MG PO TABS
1000.0000 mg | ORAL_TABLET | Freq: Two times a day (BID) | ORAL | Status: DC
  Administered 2017-08-27 – 2017-08-28 (×2): 1000 mg via ORAL
  Filled 2017-08-27 (×2): qty 2

## 2017-08-27 MED ORDER — MEPERIDINE HCL 50 MG/ML IJ SOLN: 6.2500 mg | INTRAMUSCULAR | Status: DC | PRN

## 2017-08-27 MED ORDER — STERILE WATER FOR INJECTION IJ SOLN
INTRAMUSCULAR | Status: DC | PRN
  Administered 2017-08-27: 4 mL

## 2017-08-27 MED ORDER — ONDANSETRON HCL 4 MG/2ML IJ SOLN
INTRAMUSCULAR | Status: DC | PRN
  Administered 2017-08-27: 4 mg via INTRAVENOUS

## 2017-08-27 MED ORDER — SCOPOLAMINE 1 MG/3DAYS TD PT72
1.0000 | MEDICATED_PATCH | TRANSDERMAL | Status: DC
  Administered 2017-08-27: 1.5 mg via TRANSDERMAL
  Filled 2017-08-27: qty 1

## 2017-08-27 MED ORDER — ENOXAPARIN SODIUM 40 MG/0.4ML ~~LOC~~ SOLN
40.0000 mg | SUBCUTANEOUS | Status: DC
  Filled 2017-08-27: qty 0.4

## 2017-08-27 MED ORDER — SENNOSIDES-DOCUSATE SODIUM 8.6-50 MG PO TABS
2.0000 | ORAL_TABLET | Freq: Every day | ORAL | Status: DC
  Administered 2017-08-27: 2 via ORAL
  Filled 2017-08-27: qty 2

## 2017-08-27 MED ORDER — GABAPENTIN 300 MG PO CAPS
300.0000 mg | ORAL_CAPSULE | Freq: Every day | ORAL | Status: AC
  Administered 2017-08-27: 300 mg via ORAL
  Filled 2017-08-27: qty 1

## 2017-08-27 MED ORDER — TRAMADOL HCL 50 MG PO TABS
100.0000 mg | ORAL_TABLET | Freq: Two times a day (BID) | ORAL | Status: DC | PRN
  Filled 2017-08-27: qty 2

## 2017-08-27 SURGICAL SUPPLY — 51 items
ADH SKN CLS APL DERMABOND .7 (GAUZE/BANDAGES/DRESSINGS) ×1
AGENT HMST KT MTR STRL THRMB (HEMOSTASIS)
APL ESCP 34 STRL LF DISP (HEMOSTASIS)
APPLICATOR SURGIFLO ENDO (HEMOSTASIS) IMPLANT
BAG LAPAROSCOPIC 12 15 PORT 16 (BASKET) IMPLANT
BAG RETRIEVAL 12/15 (BASKET)
BAG SPEC RTRVL LRG 6X4 10 (ENDOMECHANICALS)
COVER BACK TABLE 60X90IN (DRAPES) ×2 IMPLANT
COVER TIP SHEARS 8 DVNC (MISCELLANEOUS) ×1 IMPLANT
COVER TIP SHEARS 8MM DA VINCI (MISCELLANEOUS) ×1
DERMABOND ADVANCED (GAUZE/BANDAGES/DRESSINGS) ×1
DERMABOND ADVANCED .7 DNX12 (GAUZE/BANDAGES/DRESSINGS) ×1 IMPLANT
DRAPE ARM DVNC X/XI (DISPOSABLE) ×4 IMPLANT
DRAPE COLUMN DVNC XI (DISPOSABLE) ×1 IMPLANT
DRAPE DA VINCI XI ARM (DISPOSABLE) ×4
DRAPE DA VINCI XI COLUMN (DISPOSABLE) ×1
DRAPE SHEET LG 3/4 BI-LAMINATE (DRAPES) ×2 IMPLANT
DRAPE SURG IRRIG POUCH 19X23 (DRAPES) ×2 IMPLANT
ELECT REM PT RETURN 15FT ADLT (MISCELLANEOUS) ×2 IMPLANT
GLOVE BIO SURGEON STRL SZ 6 (GLOVE) ×8 IMPLANT
GLOVE BIO SURGEON STRL SZ 6.5 (GLOVE) ×4 IMPLANT
GOWN STRL REUS W/ TWL LRG LVL3 (GOWN DISPOSABLE) ×3 IMPLANT
GOWN STRL REUS W/TWL LRG LVL3 (GOWN DISPOSABLE) ×6
HOLDER FOLEY CATH W/STRAP (MISCELLANEOUS) ×2 IMPLANT
IRRIG SUCT STRYKERFLOW 2 WTIP (MISCELLANEOUS) ×2
IRRIGATION SUCT STRKRFLW 2 WTP (MISCELLANEOUS) ×1 IMPLANT
KIT PROCEDURE DA VINCI SI (MISCELLANEOUS) ×1
KIT PROCEDURE DVNC SI (MISCELLANEOUS) ×1 IMPLANT
MANIPULATOR UTERINE 4.5 ZUMI (MISCELLANEOUS) ×2 IMPLANT
NDL SAFETY ECLIPSE 18X1.5 (NEEDLE) IMPLANT
NEEDLE HYPO 18GX1.5 SHARP (NEEDLE)
NEEDLE SPNL 18GX3.5 QUINCKE PK (NEEDLE) ×2 IMPLANT
OBTURATOR OPTICAL STANDARD 8MM (TROCAR) ×1
OBTURATOR OPTICAL STND 8 DVNC (TROCAR) ×1
OBTURATOR OPTICALSTD 8 DVNC (TROCAR) ×1 IMPLANT
PACK ROBOT GYN CUSTOM WL (TRAY / TRAY PROCEDURE) ×2 IMPLANT
PAD POSITIONING PINK XL (MISCELLANEOUS) ×2 IMPLANT
POUCH SPECIMEN RETRIEVAL 10MM (ENDOMECHANICALS) IMPLANT
SEAL CANN UNIV 5-8 DVNC XI (MISCELLANEOUS) ×4 IMPLANT
SEAL XI 5MM-8MM UNIVERSAL (MISCELLANEOUS) ×4
SET TRI-LUMEN FLTR TB AIRSEAL (TUBING) ×2 IMPLANT
SOLUTION ELECTROLUBE (MISCELLANEOUS) IMPLANT
SURGIFLO W/THROMBIN 8M KIT (HEMOSTASIS) IMPLANT
SUT VIC AB 0 CT1 27 (SUTURE) ×2
SUT VIC AB 0 CT1 27XBRD ANTBC (SUTURE) ×1 IMPLANT
SYR 10ML LL (SYRINGE) ×2 IMPLANT
TOWEL OR NON WOVEN STRL DISP B (DISPOSABLE) ×2 IMPLANT
TRAP SPECIMEN MUCOUS 40CC (MISCELLANEOUS) IMPLANT
TRAY FOLEY W/METER SILVER 16FR (SET/KITS/TRAYS/PACK) ×2 IMPLANT
UNDERPAD 30X30 (UNDERPADS AND DIAPERS) ×2 IMPLANT
WATER STERILE IRR 1000ML POUR (IV SOLUTION) ×2 IMPLANT

## 2017-08-27 NOTE — Transfer of Care (Signed)
Immediate Anesthesia Transfer of Care Note  Patient: Stephanie Mahoney  Procedure(s) Performed: XI ROBOTIC ASSISTED TOTAL HYSTERECTOMY WITH BILATERAL SALPINGO OOPHORECTOMY (Bilateral ) SENTINEL LYMPH NODE BIOPSY (Bilateral )  Patient Location: PACU  Anesthesia Type:General  Level of Consciousness: drowsy and patient cooperative  Airway & Oxygen Therapy: Patient Spontanous Breathing and Patient connected to face mask oxygen  Post-op Assessment: Report given to RN and Post -op Vital signs reviewed and stable  Post vital signs: Reviewed and stable  Last Vitals:  Vitals:   08/27/17 0915  BP: (!) 138/92  Pulse: 97  Resp: 18  Temp: 36.8 C  SpO2: 97%    Last Pain:  Vitals:   08/27/17 0915  TempSrc: Oral      Patients Stated Pain Goal: 4 (29/56/21 3086)  Complications: No apparent anesthesia complications

## 2017-08-27 NOTE — Op Note (Signed)
OPERATIVE NOTE 08/27/17  Surgeon: Donaciano Eva   Assistants: Dr Lahoma Crocker (an MD assistant was necessary for tissue manipulation, management of robotic instrumentation, retraction and positioning due to the complexity of the case and hospital policies).   Anesthesia: General endotracheal anesthesia  ASA Class: 3   Pre-operative Diagnosis: endometrial cancer grade 1  Post-operative Diagnosis: same,   Operation: Robotic-assisted laparoscopic total hysterectomy with bilateral salpingoophorectomy, SLN biopsy   Surgeon: Donaciano Eva  Assistant Surgeon: Lahoma Crocker MD  Anesthesia: GET  Urine Output: 300cc  Operative Findings:  : 6cm normal appearing uterus, normal appearing tubes and ovaries, no suspicious nodes, no apparent extrauterine disease.  Estimated Blood Loss:  <20cc      Total IV Fluids: 700 ml         Specimens: uterus, cervix, bilateral tubes and ovaries, right external iliac SLN, left external iliac SLN 1 and 2         Complications:  None; patient tolerated the procedure well.         Disposition: PACU - hemodynamically stable.  Procedure Details  The patient was seen in the Holding Room. The risks, benefits, complications, treatment options, and expected outcomes were discussed with the patient.  The patient concurred with the proposed plan, giving informed consent.  The site of surgery properly noted/marked. The patient was identified as Stephanie Mahoney and the procedure verified as a Robotic-assisted hysterectomy with bilateral salpingo oophorectomy with SLN biopsy. A Time Out was held and the above information confirmed.  After induction of anesthesia, the patient was draped and prepped in the usual sterile manner. Pt was placed in supine position after anesthesia and draped and prepped in the usual sterile manner. The abdominal drape was placed after the CholoraPrep had been allowed to dry for 3 minutes.  Her arms were tucked to her  side with all appropriate precautions.  The shoulders were stabilized with padded shoulder blocks applied to the acromium processes.  The patient was placed in the semi-lithotomy position in [place].  The perineum was prepped with Betadine. The patient was then prepped. Foley catheter was placed.  A sterile speculum was placed in the vagina.  The cervix was grasped with a single-tooth tenaculum. 2mg  total of ICG was injected into the cervical stroma at 2 and 9 o'clock with 1cc injected at a 1cm and 61mm depth (concentration 0.5mg /ml) in all locations. The cervix was dilated with Kennon Rounds dilators.  The ZUMI uterine manipulator with a medium colpotomizer ring was placed without difficulty.  A pneum occluder balloon was placed over the manipulator.  OG tube placement was confirmed and to suction.   Next, a 5 mm skin incision was made 1 cm below the subcostal margin in the midclavicular line.  The 5 mm Optiview port and scope was used for direct entry.  Opening pressure was under 10 mm CO2.  The abdomen was insufflated and the findings were noted as above.   At this point and all points during the procedure, the patient's intra-abdominal pressure did not exceed 15 mmHg. Next, a 10 mm skin incision was made in the umbilicus and a right and left port was placed about 10 cm lateral to the robot port on the right and left side.  A fourth arm was placed in the left lower quadrant 2 cm above and superior and medial to the anterior superior iliac spine.  All ports were placed under direct visualization.  The patient was placed in steep Trendelenburg.  Bowel  was folded away into the upper abdomen.  The robot was docked in the normal manner.  The right and left peritoneum were opened parallel to the IP ligament to open the retroperitoneal spaces bilaterally. The SLN mapping was performed in bilateral pelvic basins. The para rectal and paravesical spaces were opened up entirely with careful dissection below the level of  the ureters bilaterally and to the depth of the uterine artery origin in order to skeletonize the uterine "web" and ensure visualization of all parametrial channels. The para-aortic basins were carefully exposed and evaluated for isolated para-aortic SLN's. Lymphatic channels were identified travelling to the following visualized sentinel lymph node's: left external iliac SLN 1 and 2. A channel was seen passing into an undyed SLN on the right external iliac chain, this was removed. These SLN's were separated from their surrounding lymphatic tissue, removed and sent for permanent pathology.  The hysterectomy was started after the round ligament on the right side was incised and the retroperitoneum was entered and the pararectal space was developed.  The ureter was noted to be on the medial leaf of the broad ligament.  The peritoneum above the ureter was incised and stretched and the infundibulopelvic ligament was skeletonized, cauterized and cut.  The posterior peritoneum was taken down to the level of the KOH ring.  The anterior peritoneum was also taken down.  The bladder flap was created to the level of the KOH ring.  The uterine artery on the right side was skeletonized, cauterized and cut in the normal manner.  A similar procedure was performed on the left.  The colpotomy was made and the uterus, cervix, bilateral ovaries and tubes were amputated and delivered through the vagina.  Pedicles were inspected and excellent hemostasis was achieved.    The colpotomy at the vaginal cuff was closed with Vicryl on a CT1 needle in a running manner.  Irrigation was used and excellent hemostasis was achieved.  At this point in the procedure was completed.  Robotic instruments were removed under direct visulaization.  The robot was undocked. The 10 mm ports were closed with Vicryl on a UR-5 needle and the fascia was closed with 0 Vicryl on a UR-5 needle.  The skin was closed with 4-0 Vicryl in a subcuticular manner.   Dermabond was applied.  Sponge, lap and needle counts correct x 2.  The patient was taken to the recovery room in stable condition.  The vagina was swabbed with  minimal bleeding noted.   All instrument and needle counts were correct x  3.   The patient was transferred to the recovery room in a stable condition.  Donaciano Eva, MD

## 2017-08-27 NOTE — Anesthesia Preprocedure Evaluation (Addendum)
Anesthesia Evaluation  Patient identified by MRN, date of birth, ID band Patient awake and Patient confused    Reviewed: Allergy & Precautions, Patient's Chart, lab work & pertinent test results  Airway Mallampati: II  TM Distance: >3 FB Neck ROM: Full    Dental no notable dental hx.    Pulmonary neg pulmonary ROS,    Pulmonary exam normal breath sounds clear to auscultation       Cardiovascular negative cardio ROS Normal cardiovascular exam Rhythm:Regular Rate:Normal     Neuro/Psych negative neurological ROS     GI/Hepatic negative GI ROS, Neg liver ROS,   Endo/Other  negative endocrine ROS  Renal/GU negative Renal ROS     Musculoskeletal negative musculoskeletal ROS (+)   Abdominal   Peds  Hematology negative hematology ROS (+)   Anesthesia Other Findings Day of surgery medications reviewed with the patient.  Reproductive/Obstetrics                            Anesthesia Physical Anesthesia Plan  ASA: II  Anesthesia Plan: General   Post-op Pain Management:    Induction: Intravenous  PONV Risk Score and Plan: 4 or greater and Treatment may vary due to age or medical condition, Dexamethasone, Ondansetron, Midazolam and Scopolamine patch - Pre-op  Airway Management Planned: Oral ETT  Additional Equipment:   Intra-op Plan:   Post-operative Plan: Extubation in OR  Informed Consent: I have reviewed the patients History and Physical, chart, labs and discussed the procedure including the risks, benefits and alternatives for the proposed anesthesia with the patient or authorized representative who has indicated his/her understanding and acceptance.   Dental advisory given  Plan Discussed with: CRNA and Anesthesiologist  Anesthesia Plan Comments:         Anesthesia Quick Evaluation
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
## 2017-08-27 NOTE — Anesthesia Procedure Notes (Signed)
Procedure Name: Intubation Date/Time: 08/27/2017 10:51 AM Performed by: Montel Clock, CRNA Pre-anesthesia Checklist: Patient identified, Emergency Drugs available, Suction available, Patient being monitored and Timeout performed Patient Re-evaluated:Patient Re-evaluated prior to induction Oxygen Delivery Method: Circle system utilized Preoxygenation: Pre-oxygenation with 100% oxygen Induction Type: IV induction Ventilation: Mask ventilation without difficulty and Oral airway inserted - appropriate to patient size Laryngoscope Size: Mac and 3 Grade View: Grade II Tube type: Oral Tube size: 7.0 mm Number of attempts: 1 Airway Equipment and Method: Stylet Placement Confirmation: ETT inserted through vocal cords under direct vision,  positive ETCO2 and breath sounds checked- equal and bilateral Secured at: 21 cm Tube secured with: Tape Dental Injury: Teeth and Oropharynx as per pre-operative assessment

## 2017-08-27 NOTE — Anesthesia Postprocedure Evaluation (Signed)
Anesthesia Post Note  Patient: Stephanie Mahoney  Procedure(s) Performed: XI ROBOTIC ASSISTED TOTAL HYSTERECTOMY WITH BILATERAL SALPINGO OOPHORECTOMY (Bilateral ) SENTINEL LYMPH NODE BIOPSY (Bilateral )     Patient location during evaluation: PACU Anesthesia Type: General Level of consciousness: awake and alert Pain management: pain level controlled Vital Signs Assessment: post-procedure vital signs reviewed and stable Respiratory status: spontaneous breathing, nonlabored ventilation, respiratory function stable and patient connected to nasal cannula oxygen Cardiovascular status: blood pressure returned to baseline and stable Postop Assessment: no apparent nausea or vomiting Anesthetic complications: no    Last Vitals:  Vitals:   08/27/17 1400 08/27/17 1500  BP: 105/66 (!) 99/57  Pulse: 63 65  Resp: 14 17  Temp: 36.4 C (!) 36.4 C  SpO2: 100% 100%    Last Pain:  Vitals:   08/27/17 1500  TempSrc:   PainSc: 1                  Barnet Glasgow

## 2017-08-28 DIAGNOSIS — C541 Malignant neoplasm of endometrium: Secondary | ICD-10-CM | POA: Diagnosis not present

## 2017-08-28 LAB — BASIC METABOLIC PANEL
ANION GAP: 5 (ref 5–15)
BUN: 11 mg/dL (ref 6–20)
CHLORIDE: 105 mmol/L (ref 101–111)
CO2: 26 mmol/L (ref 22–32)
Calcium: 8.7 mg/dL — ABNORMAL LOW (ref 8.9–10.3)
Creatinine, Ser: 0.64 mg/dL (ref 0.44–1.00)
GFR calc Af Amer: >60 mL/min (ref >60)
Glucose, Bld: 113 mg/dL — ABNORMAL HIGH (ref 65–99)
POTASSIUM: 4.2 mmol/L (ref 3.5–5.1)
SODIUM: 136 mmol/L (ref 135–145)

## 2017-08-28 LAB — CBC
HCT: 37.1 % (ref 36.0–46.0)
Hemoglobin: 12.3 g/dL (ref 12.0–15.0)
MCH: 29.1 pg (ref 26.0–34.0)
MCHC: 33.2 g/dL (ref 30.0–36.0)
MCV: 87.9 fL (ref 78.0–100.0)
PLATELETS: 201 10*3/uL (ref 150–400)
RBC: 4.22 MIL/uL (ref 3.87–5.11)
RDW: 13.1 % (ref 11.5–15.5)
WBC: 11.4 10*3/uL — AB (ref 4.0–10.5)

## 2017-08-28 MED ORDER — OXYCODONE HCL 5 MG PO TABS: 5.0000 mg | ORAL_TABLET | ORAL | 0 refills | Status: DC | PRN

## 2017-08-28 NOTE — Discharge Summary (Signed)
Physician Discharge Summary  Patient ID: Stephanie Mahoney MRN: [id number] DOB/AGE: [birth year] 68 y.o.  Admit date: 08/27/2017 Discharge date: 08/28/2017  Admission Diagnoses: Endometrial ca [facility])  Discharge Diagnoses:  Principal Problem:   Endometrial ca [facility]) Active Problems:   Endometrial cancer [facility])   Discharged Condition:  The patient is in good condition and stable for discharge.    Hospital Course: On 08/27/2017, the patient underwent the following: Procedure(s): XI ROBOTIC ASSISTED TOTAL HYSTERECTOMY WITH BILATERAL SALPINGO OOPHORECTOMY SENTINEL LYMPH NODE BIOPSY.   The postoperative course was uneventful.  She was discharged to home on postoperative day 1 tolerating a regular diet, voiding, ambulating, pain controlled.  Consults: None  Significant Diagnostic Studies: None  Treatments: surgery: see above  Discharge Exam: Blood pressure (!) 103/54, pulse 72, temperature 98.3 F (36.8 C), temperature source Oral, resp. rate 16, height 5' 4.5" (1.638 m), weight 130 lb (59 kg), SpO2 98 %. General appearance: alert, cooperative and no distress Resp: clear to auscultation bilaterally Cardio: regular rate and rhythm, S1, S2 normal, no murmur, click, rub or gallop GI: soft, non-tender; bowel sounds normal; no masses,  no organomegaly Extremities: extremities normal, atraumatic, no cyanosis or edema Incision/Wound: Lap sites to the abdomen with dermabond with mild ecchymosis present, no drainage or bleeding noted  Disposition: Home  Discharge Instructions    Call MD for:  difficulty breathing, headache or visual disturbances   Complete by:  As directed    Call MD for:  extreme fatigue   Complete by:  As directed    Call MD for:  hives   Complete by:  As directed    Call MD for:  persistant dizziness or light-headedness   Complete by:  As directed    Call MD for:  persistant nausea and vomiting   Complete by:  As directed    Call MD for:  redness, tenderness, or signs of  infection (pain, swelling, redness, odor or green/yellow discharge around incision site)   Complete by:  As directed    Call MD for:  severe uncontrolled pain   Complete by:  As directed    Call MD for:  temperature >100.4   Complete by:  As directed    Diet - low sodium heart healthy   Complete by:  As directed    Driving Restrictions   Complete by:  As directed    No driving for 1 week.  Do not take narcotics and drive.   Increase activity slowly   Complete by:  As directed    Lifting restrictions   Complete by:  As directed    No lifting greater than 10 lbs.   Sexual Activity Restrictions   Complete by:  As directed    No sexual activity, nothing in the vagina, for 8 weeks.     Allergies as of 08/28/2017      Reactions   Lanolin Itching      Medication List    TAKE these medications   acetaminophen 500 MG tablet Commonly known as:  TYLENOL Take 500 mg by mouth every 6 (six) hours as needed for moderate pain or headache.   CALCIUM-VITAMIN D3 PO Take 1 tablet by mouth daily.   cyclobenzaprine 5 MG tablet Commonly known as:  FLEXERIL Take 5 mg by mouth 3 (three) times daily as needed for muscle spasms.   HM VITAMIN D3 4000 units Caps Generic drug:  Cholecalciferol Take 4,000 Units by mouth daily.   ibandronate 150 MG tablet Commonly known as:  BONIVA Take 150 mg by mouth every 30 (thirty) days. Take in the morning with a full glass of water, on an empty stomach, and do not take anything else by mouth or lie down for the next 30 min.   IMODIUM PO Take by mouth.   oxyCODONE 5 MG immediate release tablet Commonly known as:  Oxy IR/ROXICODONE Take 1 tablet (5 mg total) by mouth every 4 (four) hours as needed for severe pain or breakthrough pain.        Greater than thirty minutes were spend for face to face discharge instructions and discharge orders/summary in EPIC.   Signed: Dorothyann Gibbs 08/28/2017, 10:57 AM

## 2017-08-28 NOTE — Discharge Instructions (Signed)
08/27/2017  Return to work: 4 weeks  Activity: 1. Be up and out of the bed during the day.  Take a nap if needed.  You may walk up steps but be careful and use the hand rail.  Stair climbing will tire you more than you think, you may need to stop part way and rest.   2. No lifting or straining for 6 weeks.  3. No driving for 1 weeks.  Do Not drive if you are taking narcotic pain medicine.  4. Shower daily.  Use soap and water on your incision and pat dry; don't rub.   5. No sexual activity and nothing in the vagina for 8 weeks.  Medications:  - Take ibuprofen and tylenol first line for pain control. Take these regularly (every 6 hours) to decrease the build up of pain.  - If necessary, for severe pain not relieved by ibuprofen, take oxycodone.  - While taking oxycodone you should take sennakot every night to reduce the likelihood of constipation. If this causes diarrhea, stop its use.  Diet: 1. Low sodium Heart Healthy Diet is recommended.  2. It is safe to use a laxative if you have difficulty moving your bowels.   Wound Care: 1. Keep clean and dry.  Shower daily.  Reasons to call the Doctor:   Fever - Oral temperature greater than 100.4 degrees Fahrenheit  Foul-smelling vaginal discharge  Difficulty urinating  Nausea and vomiting  Increased pain at the site of the incision that is unrelieved with pain medicine.  Difficulty breathing with or without chest pain  New calf pain especially if only on one side  Sudden, continuing increased vaginal bleeding with or without clots.   Follow-up: 1. See Everitt Amber in 3-4 weeks.  Contacts: For questions or concerns you should contact:  Dr. Everitt Amber at [phone] After hours and on week-ends call [phone] and ask to speak to the physician on call for Gynecologic Oncology  Oxycodone tablets or capsules What is this medicine? OXYCODONE (ox i KOE done) is a pain reliever. It is used to treat moderate to severe  pain. This medicine may be used for other purposes; ask your health care provider or pharmacist if you have questions. COMMON BRAND NAME(S): Dazidox, Endocodone, Oxaydo, OXECTA, OxyIR, Percolone, Roxicodone, ROXYBOND What should I tell my health care provider before I take this medicine? They need to know if you have any of these conditions: -Addison's disease -brain tumor -head injury -heart disease -history of drug or alcohol abuse problem -if you often drink alcohol -kidney disease -liver disease -lung or breathing disease, like asthma -mental illness -pancreatic disease -seizures -thyroid disease -an unusual or allergic reaction to oxycodone, codeine, hydrocodone, morphine, other medicines, foods, dyes, or preservatives -pregnant or trying to get pregnant -breast-feeding How should I use this medicine? Take this medicine by mouth with a glass of water. Follow the directions on the prescription label. You can take it with or without food. If it upsets your stomach, take it with food. Take your medicine at regular intervals. Do not take it more often than directed. Do not stop taking except on your doctor's advice. Some brands of this medicine, like Oxecta, have special instructions. Ask your doctor or pharmacist if these directions are for you: Do not cut, crush or chew this medicine. Swallow only one tablet at a time. Do not wet, soak, or lick the tablet before you take it. A special MedGuide will be given to you by the pharmacist  with each prescription and refill. Be sure to read this information carefully each time. Talk to your pediatrician regarding the use of this medicine in children. Special care may be needed. Overdosage: If you think you have taken too much of this medicine contact a poison control center or emergency room at once. NOTE: This medicine is only for you. Do not share this medicine with others. What if I miss a dose? If you miss a dose, take it as soon as you  can. If it is almost time for your next dose, take only that dose. Do not take double or extra doses. What may interact with this medicine? This medicine may interact with the following medications: -alcohol -antihistamines for allergy, cough and cold -antiviral medicines for HIV or AIDS -atropine -certain antibiotics like clarithromycin, erythromycin, linezolid, rifampin -certain medicines for anxiety or sleep -certain medicines for bladder problems like oxybutynin, tolterodine -certain medicines for depression like amitriptyline, fluoxetine, sertraline -certain medicines for fungal infections like ketoconazole, itraconazole, voriconazole -certain medicines for migraine headache like almotriptan, eletriptan, frovatriptan, naratriptan, rizatriptan, sumatriptan, zolmitriptan -certain medicines for nausea or vomiting like dolasetron, ondansetron, palonosetron -certain medicines for Parkinson's disease like benztropine, trihexyphenidyl -certain medicines for seizures like phenobarbital, phenytoin, primidone -certain medicines for stomach problems like dicyclomine, hyoscyamine -certain medicines for travel sickness like scopolamine -diuretics -general anesthetics like halothane, isoflurane, methoxyflurane, propofol -ipratropium -local anesthetics like lidocaine, pramoxine, tetracaine -MAOIs like Carbex, Eldepryl, Marplan, Nardil, and Parnate -medicines that relax muscles for surgery -methylene blue -nilotinib -other narcotic medicines for pain or cough -phenothiazines like chlorpromazine, mesoridazine, prochlorperazine, thioridazine This list may not describe all possible interactions. Give your health care provider a list of all the medicines, herbs, non-prescription drugs, or dietary supplements you use. Also tell them if you smoke, drink alcohol, or use illegal drugs. Some items may interact with your medicine. What should I watch for while using this medicine? Tell your doctor or health  care professional if your pain does not go away, if it gets worse, or if you have new or a different type of pain. You may develop tolerance to the medicine. Tolerance means that you will need a higher dose of the medicine for pain relief. Tolerance is normal and is expected if you take this medicine for a long time. Do not suddenly stop taking your medicine because you may develop a severe reaction. Your body becomes used to the medicine. This does NOT mean you are addicted. Addiction is a behavior related to getting and using a drug for a non-medical reason. If you have pain, you have a medical reason to take pain medicine. Your doctor will tell you how much medicine to take. If your doctor wants you to stop the medicine, the dose will be slowly lowered over time to avoid any side effects. There are different types of narcotic medicines (opiates). If you take more than one type at the same time or if you are taking another medicine that also causes drowsiness, you may have more side effects. Give your health care provider a list of all medicines you use. Your doctor will tell you how much medicine to take. Do not take more medicine than directed. Call emergency for help if you have problems breathing or unusual sleepiness. You may get drowsy or dizzy. Do not drive, use machinery, or do anything that needs mental alertness until you know how the medicine affects you. Do not stand or sit up quickly, especially if you are an older patient. This reduces  the risk of dizzy or fainting spells. Alcohol may interfere with the effect of this medicine. Avoid alcoholic drinks. This medicine will cause constipation. Try to have a bowel movement at least every 2 to 3 days. If you do not have a bowel movement for 3 days, call your doctor or health care professional. Your mouth may get dry. Chewing sugarless gum or sucking hard candy, and drinking plenty of water may help. Contact your doctor if the problem does not go away  or is severe. What side effects may I notice from receiving this medicine? Side effects that you should report to your doctor or health care professional as soon as possible: -allergic reactions like skin rash, itching or hives, swelling of the face, lips, or tongue -breathing problems -confusion -signs and symptoms of low blood pressure like dizziness; feeling faint or lightheaded, falls; unusually weak or tired -trouble passing urine or change in the amount of urine -trouble swallowing Side effects that usually do not require medical attention (report to your doctor or health care professional if they continue or are bothersome): -constipation -dry mouth -nausea, vomiting -tiredness This list may not describe all possible side effects. Call your doctor for medical advice about side effects. You may report side effects to FDA at 1-800-FDA-1088. Where should I keep my medicine? Keep out of the reach of children. This medicine can be abused. Keep your medicine in a safe place to protect it from theft. Do not share this medicine with anyone. Selling or giving away this medicine is dangerous and against the law. Store at room temperature between 15 and 30 degrees C (59 and 86 degrees F). Protect from light. Keep container tightly closed. This medicine may cause accidental overdose and death if it is taken by other adults, children, or pets. Flush any unused medicine down the toilet to reduce the chance of harm. Do not use the medicine after the expiration date. NOTE: This sheet is a summary. It may not cover all possible information. If you have questions about this medicine, talk to your doctor, pharmacist, or health care provider.  2018 Elsevier/Gold Standard (2015-06-07 16:55:57)

## 2017-08-30 ENCOUNTER — Telehealth: Payer: Self-pay | Admitting: Gynecologic Oncology

## 2017-08-30 NOTE — Telephone Encounter (Signed)
Attempted to call patient to check on post-op status.  Left message informing her that she had a Stage IA endo ca with no further treatment recommended per Dr. Denman George.  Advised to call the office for any questions or concerns.

## 2017-09-05 ENCOUNTER — Telehealth: Payer: Self-pay | Admitting: *Deleted

## 2017-09-05 NOTE — Telephone Encounter (Signed)
Returned the patient's call and advised her of the following per her questions per Melissa APP  -no lifting over 5lbs,no vacuuming/house work  until after post op appt  -Doctor will discuss the estrogen at the post op  appt

## 2017-09-25 ENCOUNTER — Inpatient Hospital Stay: Payer: BLUE CROSS/BLUE SHIELD | Attending: Gynecologic Oncology | Admitting: Gynecologic Oncology

## 2017-09-25 ENCOUNTER — Encounter: Payer: Self-pay | Admitting: Gynecologic Oncology

## 2017-09-25 VITALS — BP 140/80 | HR 88 | Temp 98.3°F | Resp 20 | Wt 142.0 lb

## 2017-09-25 DIAGNOSIS — C541 Malignant neoplasm of endometrium: Secondary | ICD-10-CM | POA: Diagnosis not present

## 2017-09-25 DIAGNOSIS — Z90710 Acquired absence of both cervix and uterus: Secondary | ICD-10-CM | POA: Insufficient documentation

## 2017-09-25 DIAGNOSIS — Z7189 Other specified counseling: Secondary | ICD-10-CM

## 2017-09-25 DIAGNOSIS — Z90722 Acquired absence of ovaries, bilateral: Secondary | ICD-10-CM | POA: Insufficient documentation

## 2017-09-25 NOTE — Progress Notes (Signed)
Follow-up Note: Gyn-Onc New Patient Consultation  Stephanie Mahoney 67 y.o. female  CC:  Chief Complaint  Patient presents with  . Endometrial Cancer    Follow up    Assessment/Plan: 67 year old with a stage IA grade 1 endometrioid adenocarcinoma s/p robotic assisted staging with hysterectomy on 08/27/17. Pathology revealed low risk factors for recurrence, therefore no adjuvant therapy is recommended according to NCCN guidelines.  I discussed risk for recurrence and typical symptoms encouraged her to notify us of these should they develop between visits.  I recommend she have follow-up every 6 months for 5 years in accordance with NCCN guidelines. Those visits should include symptom assessment, physical exam and pelvic examination. Pap smears are not indicated or recommended in the routine surveillance of endometrial cancer.  She will return to see Dr Nori Riis in 12 months and myself in 6 months.    HPI: Patient is seen today in consultation at the request of Dr. Nori Riis for newly diagnosed endometrial cancer. Primary physician: Dr. Crist Infante.  Patient is a 67 year old gravida 4 para 3 who has been menopausal since about the age of 60 and has been on hormone replacement therapy for approximately 10 years. This was in large part secondary to her scoliosis which got worse during menopause. She had normal Pap smear in December 2017. She had been on a Vivelle 0.1 patch as well as Prometrium 100 mg. Intermittently she would have a little bit of spotting particularly if she forgot to change her patch her missed a day of her medications so spotting was in exactly abnormal was different was the spotting that she had in November with lasted longer than it usually date and this is what led to her to follow-up with Dr. Nori Riis.  She had an ultrasound on 06/20/2017. It revealed the uterus to be 9.8 x 5.3 x 6.1 cm. However, should a 2.24 cm endometrial thickness with increased blood flow. She underwent a D&C on  November 29 that revealed a grade 1 endometrial cancer.  She is otherwise without complaints. She did take Augmentin which caused some diarrhea. She was on the Augmentin secondary to an accident she had with her daughter that led to an infected injury on her arm. She exercises twice a day walks about 20-25 minutes at a time. Her METs or greater than 4. She does stretching exercises.  She is up-to-date on her mammograms having had one in February 2018. She has another one scheduled for February 2019. She underwent a colonoscopy by 8-10 years ago. She did: Guarded which she recently got back and it was negative.  Interval Hx:  On 08/27/17 she underwent robotic assisted total hysterectomy, BSO, SLN biopsy. Final pathology revealed a 4.5cm tumor which was grade 1 endometrioid histology. There was inner half (0.4 of 2.2cm) myometrial invasion. LVSI was absent. 2 left pelvic SLN's were negative for metastatic disease. The right pelvic SLN contained only fibroadipose tissue (it was undyed on mapping). She was determined to have low risk disease and in accordance with NCCN guidelines no further adjuvant therapy was recommended.  Review of Systems: Constitutional: Denies fever. Skin: No rash Cardiovascular: No chest pain, shortness of breath, or edema  Pulmonary: No cough  Gastro Intestinal: No nausea, vomiting, constipation, or diarrhea reported.  Genitourinary: no vaginal bleeding   Musculoskeletal: + scoliosis, no pain Psychology: No concerns  Current Meds:  Outpatient Encounter Medications as of 09/25/2017  Medication Sig  . Pseudoephedrine-Guaifenesin (MUCINEX D PO) Take by mouth 2 (two) times daily.  Marland Kitchen  acetaminophen (TYLENOL) 500 MG tablet Take 500 mg by mouth every 6 (six) hours as needed for moderate pain or headache.  . Calcium Carbonate-Vitamin D (CALCIUM-VITAMIN D3 PO) Take 1 tablet by mouth daily.  . cefdinir (OMNICEF) 300 MG capsule TAKE ONE CAPSULE BY MOUTH TWICE A DAY FOR 10 DAYS  .  Cholecalciferol (HM VITAMIN D3) 4000 units CAPS Take 4,000 Units by mouth daily.  . cyclobenzaprine (FLEXERIL) 5 MG tablet Take 5 mg by mouth 3 (three) times daily as needed for muscle spasms.   . fluticasone (FLONASE) 50 MCG/ACT nasal spray SPRAY 2 SPRAYS INTO EACH NOSTRIL EVERY DAY  . ibandronate (BONIVA) 150 MG tablet Take 150 mg by mouth every 30 (thirty) days. Take in the morning with a full glass of water, on an empty stomach, and do not take anything else by mouth or lie down for the next 30 min.  . Loperamide HCl (IMODIUM PO) Take by mouth.  . predniSONE (STERAPRED UNI-PAK 21 TAB) 10 MG (21) TBPK tablet TAKE AS DIRECTED ON FOIL PACK  . PROAIR HFA 108 (90 Base) MCG/ACT inhaler INHALE 2 PUFFS EVERY 4 TO 6 HRS AS NEEDED FOR WHEEZE, SHORTNESS OF BREATH  . [DISCONTINUED] oxyCODONE (OXY IR/ROXICODONE) 5 MG immediate release tablet Take 1 tablet (5 mg total) by mouth every 4 (four) hours as needed for severe pain or breakthrough pain.   No facility-administered encounter medications on file as of 09/25/2017.     Allergy:  Allergies  Allergen Reactions  . Lanolin Itching    Social Hx:   Social History   Socioeconomic History  . Marital status: Married    Spouse name: Not on file  . Number of children: Not on file  . Years of education: Not on file  . Highest education level: Not on file  Social Needs  . Financial resource strain: Not on file  . Food insecurity - worry: Not on file  . Food insecurity - inability: Not on file  . Transportation needs - medical: Not on file  . Transportation needs - non-medical: Not on file  Occupational History  . Not on file  Tobacco Use  . Smoking status: Never Smoker  . Smokeless tobacco: Never Used  Substance and Sexual Activity  . Alcohol use: Yes    Comment: 2-3 glasses of wine on the weekend, no more than 1 a day  . Drug use: No  . Sexual activity: Not on file  Other Topics Concern  . Not on file  Social History Narrative  . Not on  file    Past Surgical Hx:  Past Surgical History:  Procedure Laterality Date  . bilateral cataract surgery     . DILATION AND CURETTAGE OF UTERUS     recently and 3 yrs ago  . LYMPH NODE BIOPSY Bilateral 08/27/2017   Procedure: SENTINEL LYMPH NODE BIOPSY;  Surgeon: Everitt Amber, MD;  Location: [facility];  Service: Gynecology;  Laterality: Bilateral;  . ROBOTIC ASSISTED TOTAL HYSTERECTOMY WITH BILATERAL SALPINGO OOPHERECTOMY Bilateral 08/27/2017   Procedure: XI ROBOTIC ASSISTED TOTAL HYSTERECTOMY WITH BILATERAL SALPINGO OOPHORECTOMY;  Surgeon: Everitt Amber, MD;  Location: [facility];  Service: Gynecology;  Laterality: Bilateral;  . SPINAL FUSION  1966    Past Medical Hx:  Past Medical History:  Diagnosis Date  . Arthritis    thumbs   . Mitral valve prolapse   . Pneumonia    hx of 30 years ago   . Postmenopausal bleeding   . Raynaud's disease  mostly in hands   . Raynaud's disease   . Scoliosis   . Uterine cancer ([place])   . Vaginal delivery    x3    Oncology Hx:    Endometrial ca [facility])   07/04/2017 Initial Diagnosis    Endometrial ca (HCC)-grade 1       Family Hx:  Family History  Problem Relation Age of Onset  . Osteoporosis Mother   . BRCA 1/2 Mother   . Hypertension Mother   . CVA Mother   . Breast cancer Mother   . Hypertension Father   . CVA Father   . Cancer Sister        in leg  . Scleroderma Sister   . Osteoporosis Sister   . Osteoporosis Sister   . Hypertension Sister     Vitals:  Blood pressure 140/80, pulse 88, temperature 98.3 F (36.8 C), temperature source Oral, resp. rate 20, weight 142 lb (64.4 kg), SpO2 99 %.  Physical Exam: Well-nourished well-developed female in no acute distress.  Neck: Supple, no lymphadenopathy, no thyromegaly.  Lungs: Clear to auscultation bilaterally. Significant kyphoscoliosis.  Cardiac: Regular rate and rhythm.  Abdomen: Soft, nontender, nondistended. There are no palpable masses or hepatosplenomegaly.  Groins:  No lymphadenopathy.  Extremity: No edema.  Pelvic: External genitalia within normal limits though atrophic. The vagina is atrophic. The vaginal cuff is healing normally, in tact and with no bleeding.    20 minutes of direct face to face counseling time was spent with the patient. This included discussion about prognosis, therapy recommendations and postoperative side effects and are beyond the scope of routine postoperative care.   Donaciano Eva, MD 09/25/2017, 4:25 PM

## 2017-09-25 NOTE — Patient Instructions (Signed)
Plan to follow up in six months or sooner if needed.  Please call our office in May or June to schedule an appointment with Dr. Denman George for end of August/ early Sept.  Please call for any new symptoms including vaginal bleeding, bloating, abdominal pain or for any questions or concerns.

## 2017-10-07 ENCOUNTER — Other Ambulatory Visit: Payer: Self-pay | Admitting: Obstetrics & Gynecology

## 2017-10-07 DIAGNOSIS — Z1231 Encounter for screening mammogram for malignant neoplasm of breast: Secondary | ICD-10-CM

## 2017-10-24 ENCOUNTER — Ambulatory Visit
Admission: RE | Admit: 2017-10-24 | Discharge: 2017-10-24 | Disposition: A | Discharge status: Home / Self Care | Payer: BLUE CROSS/BLUE SHIELD | Source: Ambulatory Visit | Attending: Obstetrics & Gynecology | Admitting: Obstetrics & Gynecology

## 2017-10-24 DIAGNOSIS — Z1231 Encounter for screening mammogram for malignant neoplasm of breast: Secondary | ICD-10-CM

## 2018-02-14 ENCOUNTER — Telehealth: Payer: Self-pay

## 2018-02-14 NOTE — Telephone Encounter (Signed)
Incoming call from patient regarding she has been having to urinate a little more after she has urinated, either right after or has to return to bathroom.  Denies fever, pain, burning, odor, bleeding.  Denies thought that it could be UTI. Per notes, pt needs f/u appt with Dr Denman George in August.  Pt reports she had hysterectomy in Feb and reports she hasn't made f/u appt yet.   Made appt with Dr Denman George for July 22 nd at 11am, arrive at 10:45 am.  Pt agreeable.  Reminded her to contact our office if symptoms worsen ie fever, bleeding, or pain.  Also reminded her of option of following up with her PCP or urgent care if needed. No other needs per pt at this time.  Notified Joylene John NP, ok with f/u appt made for pt.

## 2018-02-24 ENCOUNTER — Telehealth: Payer: Self-pay | Admitting: Oncology

## 2018-02-24 ENCOUNTER — Inpatient Hospital Stay: Payer: BLUE CROSS/BLUE SHIELD

## 2018-02-24 ENCOUNTER — Inpatient Hospital Stay: Payer: BLUE CROSS/BLUE SHIELD | Attending: Gynecologic Oncology | Admitting: Gynecologic Oncology

## 2018-02-24 ENCOUNTER — Encounter: Payer: Self-pay | Admitting: Gynecologic Oncology

## 2018-02-24 VITALS — BP 108/67 | HR 77 | Temp 97.8°F | Resp 20 | Ht 64.0 in | Wt 134.0 lb

## 2018-02-24 DIAGNOSIS — Z90710 Acquired absence of both cervix and uterus: Secondary | ICD-10-CM | POA: Insufficient documentation

## 2018-02-24 DIAGNOSIS — Z90722 Acquired absence of ovaries, bilateral: Secondary | ICD-10-CM | POA: Insufficient documentation

## 2018-02-24 DIAGNOSIS — C541 Malignant neoplasm of endometrium: Secondary | ICD-10-CM | POA: Diagnosis present

## 2018-02-24 DIAGNOSIS — N898 Other specified noninflammatory disorders of vagina: Secondary | ICD-10-CM

## 2018-02-24 DIAGNOSIS — N899 Noninflammatory disorder of vagina, unspecified: Secondary | ICD-10-CM | POA: Diagnosis not present

## 2018-02-24 LAB — BASIC METABOLIC PANEL
Anion gap: 7 (ref 5–15)
BUN: 15 mg/dL (ref 8–23)
CALCIUM: 10.1 mg/dL (ref 8.9–10.3)
CHLORIDE: 105 mmol/L (ref 98–111)
CO2: 28 mmol/L (ref 22–32)
CREATININE: 0.69 mg/dL (ref 0.44–1.00)
GFR calc non Af Amer: >60 mL/min (ref >60)
Glucose, Bld: 91 mg/dL (ref 70–99)
Potassium: 4.2 mmol/L (ref 3.5–5.1)
SODIUM: 140 mmol/L (ref 135–145)

## 2018-02-24 NOTE — Patient Instructions (Signed)
We will contact you with the results of your biopsy from today.  Plan on having a CT scan of the abdomen and pelvis.  We will be obtaining a metabolic lab panel today to evaluate your kidney function prior to receiving contrast for the CT scan.   We will also arrange for you to meet with Dr. Maryland Pink, Urogynecologist, for your urinary symptoms.  Please call our office for any questions or concerns.

## 2018-02-24 NOTE — Progress Notes (Signed)
Follow-up Note: Gyn-Onc New Patient Consultation  Stephanie Mahoney 67 y.o. female  CC:  Chief Complaint  Patient presents with  . Endometrial ca [facility])    Assessment/Plan: 67 year old with history of a stage IA grade 1 endometrioid adenocarcinoma (MSI stable, MMR normal) s/p robotic assisted staging with hysterectomy on 08/27/17. Pathology revealed low risk factors for recurrence, therefore no adjuvant therapy was recommended according to NCCN guidelines.  1/ Vaginal lesion seen on exam: recurrence vs granulation tissue. Will follow-up the biopsy. If positive for recurrence, will obtain PET and referral to radiation. If benign (granulation tissue), will have her follow-up routinely. She have follow-up every 6 months for 5 years in accordance with NCCN guidelines. Those visits should include symptom assessment, physical exam and pelvic examination. Pap smears are not indicated or recommended in the routine surveillance of endometrial cancer. She will return to see Dr Stephanie Mahoney in 6 months and myself in 12 months.   2/ Sensation of incomplete urinary voiding: will obtain CT scan to rule out mass lesions/space occupying lesions that may be the cause of her symptoms. If negative, will have her seen by Dr Maryland Pink to be further evaluated.  HPI: Patient is seen today in consultation at the request of Dr. Nori Mahoney for newly diagnosed endometrial cancer. Primary physician: Dr. Crist Mahoney.  Patient is a 67 year old gravida 4 para 3 who has been menopausal since about the age of 70 and has been on hormone replacement therapy for approximately 10 years. This was in large part secondary to her scoliosis which got worse during menopause. She had normal Pap smear in December 2017. She had been on a Vivelle 0.1 patch as well as Prometrium 100 mg. Intermittently she would have a little bit of spotting particularly if she forgot to change her patch her missed a day of her medications so spotting was in  exactly abnormal was different was the spotting that she had in November with lasted longer than it usually date and this is what led to her to follow-up with Dr. Nori Mahoney.  She had an ultrasound on 06/20/2017. It revealed the uterus to be 9.8 x 5.3 x 6.1 cm. However, should a 2.24 cm endometrial thickness with increased blood flow. She underwent a D&C on November 29 that revealed a grade 1 endometrial cancer.  She is otherwise without complaints. She did take Augmentin which caused some diarrhea. She was on the Augmentin secondary to an accident she had with her daughter that led to an infected injury on her arm. She exercises twice a day walks about 20-25 minutes at a time. Her METs or greater than 4. She does stretching exercises.  She is up-to-date on her mammograms having had one in February 2018. She has another one scheduled for February 2019. She underwent a colonoscopy by 8-10 years ago. She did: Guarded which she recently got back and it was negative.  On 08/27/17 she underwent robotic assisted total hysterectomy, BSO, SLN biopsy. Final pathology revealed a 4.5cm tumor which was grade 1 endometrioid histology. There was inner half (0.4 of 2.2cm) myometrial invasion. LVSI was absent. 2 left pelvic SLN's were negative for metastatic disease. The right pelvic SLN contained only fibroadipose tissue (it was undyed on mapping). MSI stable, MMR normal. She was determined to have low risk disease and in accordance with NCCN guidelines no further adjuvant therapy was recommended.   Interval Hx:  She has been doing well, however since May, 2019 had developed symptoms of incomplete urinary emptying. She has  a normal urge to void, then after voiding and standing she then feels that she needs to go again. Denies incontinence. Denies post micturition dribbling. Denies urgency. Denies vaginal bleeding or post coital bleeding.   Review of Systems: Constitutional: Denies fever. Skin: No  rash Cardiovascular: No chest pain, shortness of breath, or edema  Pulmonary: No cough  Gastro Intestinal: No nausea, vomiting, constipation, or diarrhea reported.  Genitourinary: no vaginal bleeding, + sensation of incomplete urinary voiding/emptying Musculoskeletal: + scoliosis, no pain Psychology: No concerns  Current Meds:  Outpatient Encounter Medications as of 02/24/2018  Medication Sig  . acetaminophen (TYLENOL) 500 MG tablet Take 500 mg by mouth every 6 (six) hours as needed for moderate pain or headache.  . Calcium Carbonate-Vitamin D (CALCIUM-VITAMIN D3 PO) Take 1 tablet by mouth daily.  . Cholecalciferol (HM VITAMIN D3) 4000 units CAPS Take 4,000 Units by mouth daily.  . cyclobenzaprine (FLEXERIL) 5 MG tablet Take 5 mg by mouth 3 (three) times daily as needed for muscle spasms.   Marland Kitchen ibandronate (BONIVA) 150 MG tablet Take 150 mg by mouth every 30 (thirty) days. Take in the morning with a full glass of water, on an empty stomach, and do not take anything else by mouth or lie down for the next 30 min.  . [DISCONTINUED] cefdinir (OMNICEF) 300 MG capsule TAKE ONE CAPSULE BY MOUTH TWICE A DAY FOR 10 DAYS  . [DISCONTINUED] fluticasone (FLONASE) 50 MCG/ACT nasal spray SPRAY 2 SPRAYS INTO EACH NOSTRIL EVERY DAY  . [DISCONTINUED] Loperamide HCl (IMODIUM PO) Take by mouth.  . [DISCONTINUED] predniSONE (STERAPRED UNI-PAK 21 TAB) 10 MG (21) TBPK tablet TAKE AS DIRECTED ON FOIL PACK  . [DISCONTINUED] PROAIR HFA 108 (90 Base) MCG/ACT inhaler INHALE 2 PUFFS EVERY 4 TO 6 HRS AS NEEDED FOR WHEEZE, SHORTNESS OF BREATH  . [DISCONTINUED] Pseudoephedrine-Guaifenesin (MUCINEX D PO) Take by mouth 2 (two) times daily.   No facility-administered encounter medications on file as of 02/24/2018.     Allergy:  Allergies  Allergen Reactions  . Lanolin Itching    Social Hx:   Social History   Socioeconomic History  . Marital status: Married    Spouse name: Not on file  . Number of children: Not on  file  . Years of education: Not on file  . Highest education level: Not on file  Occupational History  . Not on file  Social Needs  . Financial resource strain: Not on file  . Food insecurity:    Worry: Not on file    Inability: Not on file  . Transportation needs:    Medical: Not on file    Non-medical: Not on file  Tobacco Use  . Smoking status: Never Smoker  . Smokeless tobacco: Never Used  Substance and Sexual Activity  . Alcohol use: Yes    Comment: 2-3 glasses of wine on the weekend, no more than 1 a day  . Drug use: No  . Sexual activity: Not on file  Lifestyle  . Physical activity:    Days per week: Not on file    Minutes per session: Not on file  . Stress: Not on file  Relationships  . Social connections:    Talks on phone: Not on file    Gets together: Not on file    Attends religious service: Not on file    Active member of club or organization: Not on file    Attends meetings of clubs or organizations: Not on file    Relationship  status: Not on file  . Intimate partner violence:    Fear of current or ex partner: Not on file    Emotionally abused: Not on file    Physically abused: Not on file    Forced sexual activity: Not on file  Other Topics Concern  . Not on file  Social History Narrative  . Not on file    Past Surgical Hx:  Past Surgical History:  Procedure Laterality Date  . bilateral cataract surgery     . DILATION AND CURETTAGE OF UTERUS     recently and 3 yrs ago  . LYMPH NODE BIOPSY Bilateral 08/27/2017   Procedure: SENTINEL LYMPH NODE BIOPSY;  Surgeon: Everitt Amber, MD;  Location: [facility];  Service: Gynecology;  Laterality: Bilateral;  . ROBOTIC ASSISTED TOTAL HYSTERECTOMY WITH BILATERAL SALPINGO OOPHERECTOMY Bilateral 08/27/2017   Procedure: XI ROBOTIC ASSISTED TOTAL HYSTERECTOMY WITH BILATERAL SALPINGO OOPHORECTOMY;  Surgeon: Everitt Amber, MD;  Location: [facility];  Service: Gynecology;  Laterality: Bilateral;  . SPINAL FUSION  1966    Past  Medical Hx:  Past Medical History:  Diagnosis Date  . Arthritis    thumbs   . Mitral valve prolapse   . Pneumonia    hx of 30 years ago   . Postmenopausal bleeding   . Raynaud's disease    mostly in hands   . Raynaud's disease   . Scoliosis   . Tinnitus   . Uterine cancer ([place])   . Vaginal delivery    x3    Oncology Hx:    Endometrial ca [facility])   07/04/2017 Initial Diagnosis    Endometrial ca (HCC)-grade 1       Family Hx:  Family History  Problem Relation Age of Onset  . Osteoporosis Mother   . BRCA 1/2 Mother   . Hypertension Mother   . CVA Mother   . Breast cancer Mother 8  . Hypertension Father   . CVA Father   . Cancer Sister        in leg  . Scleroderma Sister   . Osteoporosis Sister   . Osteoporosis Sister   . Hypertension Sister     Vitals:  Blood pressure 108/67, pulse 77, temperature 97.8 F (36.6 C), temperature source Oral, resp. rate 20, height '5\' 4"'  (1.626 m), weight 134 lb (60.8 kg), SpO2 100 %.  Physical Exam: Well-nourished well-developed female in no acute distress.  Neck: Supple, no lymphadenopathy, no thyromegaly.  Lungs: Clear to auscultation bilaterally. Significant kyphoscoliosis.  Cardiac: Regular rate and rhythm.  Abdomen: Soft, nontender, nondistended. There are no palpable masses or hepatosplenomegaly.  Groins: No lymphadenopathy.  Extremity: No edema.  Pelvic: External genitalia within normal limits though atrophic. The vagina is atrophic. The vaginal cuff has a slightly friable 3x3cm area at the right aspect of the cuff. It is is fleshy and bleeds easily. It was biopsied.  PROCEDURE NOTE:  Vaginal biopsy  Preop Dx: vaginal mass, hx of endometrial cancer Postop Dx: same Procedure: vaginal biopsy Specimen: vaginal biopsy for pathology EBL: minimal Surgeon: Dorann Ou Complications: none Procedure: The patient provided verbal consent.  Verbal timeout was performed.  Speculum was inserted into the vagina and the lesion  was identified.  It was grasped using a Kevorkian biopsy forcep.  Hemostasis was obtained with silver nitrate sticks.  The patient tolerated the procedure well.   Thereasa Solo, MD 02/24/2018, 5:48 PM

## 2018-02-24 NOTE — Telephone Encounter (Signed)
Called in referral to Dr. Zigmund Daniel office at [facility].  Appointment scheduled for Wednesday, 02/26/18 at 9:45 am at the [facility] office.  Called patient and she is not able to keep this appointment due to her work schedule.  She said she would like to call to schedule the appointment and was given the phone number for Dr. Zigmund Daniel' office [phone].

## 2018-02-26 NOTE — Telephone Encounter (Signed)
Called patient and asked if she has an appointment with Dr. Zigmund Daniel.  She said she has an appointment today at 2 pm.

## 2018-02-27 ENCOUNTER — Telehealth: Payer: Self-pay

## 2018-02-27 NOTE — Telephone Encounter (Signed)
-----   Message from Everitt Amber, MD sent at 02/27/2018  7:05 AM EDT ----- Would you mind letting her know that her biopsy showed no cancer (just scar). Recommend followup in 6 months.  Everitt Amber  ----- Message ----- From: Interface, Lab In [facility] [place] [facility] Sent: 02/26/2018   3:15 PM To: Everitt Amber, MD

## 2018-02-27 NOTE — Telephone Encounter (Signed)
Told Stephanie Mahoney that the biopsy showed scar tissue and no cancer as noted below by Dr. Denman George.

## 2018-02-28 ENCOUNTER — Telehealth (HOSPITAL_COMMUNITY): Payer: Self-pay

## 2018-02-28 ENCOUNTER — Ambulatory Visit (HOSPITAL_COMMUNITY)
Admission: RE | Admit: 2018-02-28 | Discharge: 2018-02-28 | Disposition: A | Discharge status: Home / Self Care | Payer: BLUE CROSS/BLUE SHIELD | Source: Ambulatory Visit | Attending: Gynecologic Oncology | Admitting: Gynecologic Oncology

## 2018-02-28 DIAGNOSIS — C541 Malignant neoplasm of endometrium: Secondary | ICD-10-CM | POA: Insufficient documentation

## 2018-02-28 DIAGNOSIS — Z90710 Acquired absence of both cervix and uterus: Secondary | ICD-10-CM | POA: Insufficient documentation

## 2018-02-28 DIAGNOSIS — K8020 Calculus of gallbladder without cholecystitis without obstruction: Secondary | ICD-10-CM | POA: Diagnosis not present

## 2018-02-28 DIAGNOSIS — M419 Scoliosis, unspecified: Secondary | ICD-10-CM | POA: Insufficient documentation

## 2018-02-28 DIAGNOSIS — N899 Noninflammatory disorder of vagina, unspecified: Secondary | ICD-10-CM | POA: Diagnosis not present

## 2018-02-28 DIAGNOSIS — N898 Other specified noninflammatory disorders of vagina: Secondary | ICD-10-CM

## 2018-02-28 MED ORDER — IOPAMIDOL (ISOVUE-300) INJECTION 61%
INTRAVENOUS | Status: AC
  Filled 2018-02-28: qty 100

## 2018-02-28 MED ORDER — IOPAMIDOL (ISOVUE-300) INJECTION 61%
100.0000 mL | Freq: Once | INTRAVENOUS | Status: AC | PRN
  Administered 2018-02-28: 100 mL via INTRAVENOUS

## 2018-02-28 NOTE — Telephone Encounter (Signed)
Told Stephanie Mahoney that her CT Abdomen and Pelvis from today did not show any findings of recurrent or residual disease per [facility]. She has a gallstone and severe scoliosis. Stephanie Mahoney aware of these findings.  Stephanie Mahoney after the CT scan noticed her skin reddened in the groin area.  It is not itchy at this time.  Her skin is very sensitive. She has Triamcinolone Acetamide 0.1 % cream on hand. Told her the [facility] said she could apply this to the affected areas.  If the rash becomes worse, she can take benadryl.  She would need to let this office know if rash gets worse as it could be a reaction to the IV contrast of the CT.  Stephanie Mahoney verbalized understanding

## 2018-03-03 ENCOUNTER — Telehealth: Payer: Self-pay

## 2018-03-03 NOTE — Telephone Encounter (Signed)
Ms Sparkman had a CT scan on Friday 02-28-18  She developed a red rash in the groin along the panty line. This weekend she also developed a red itch rash above the injection site for the CT.  She is using benadryl at hs as she works during the day. Pt has very sensitive skin. Using Hydrocortisone cream to affected areas. Reviewed with Joylene John, NP. Continue benadryl prn and hydrocortisone cream. Ras is getting lighter  as the day has progressed  She would need to inform prescriber of these symptoms if another CT needed as it might be a reaction to the IV contrast or may not be. Pt verbalized understanding.

## 2018-03-06 ENCOUNTER — Telehealth: Payer: Self-pay | Admitting: *Deleted

## 2018-03-06 ENCOUNTER — Other Ambulatory Visit: Payer: Self-pay | Admitting: Gynecologic Oncology

## 2018-03-06 DIAGNOSIS — L231 Allergic contact dermatitis due to adhesives: Secondary | ICD-10-CM

## 2018-03-06 MED ORDER — PREDNISONE 5 MG PO TABS: ORAL_TABLET | ORAL | 0 refills | Status: DC

## 2018-03-06 NOTE — Telephone Encounter (Signed)
Patient called and stated "I called last week and spoke to [facility] about the rash on my arm. It is still bothering me and I can't sleep. It is driving my husband crazy. He told me I had to call today and get something done. The cream is not helping.  I have raised bumps, it is still very red from mid lower arm to mid upper arm, itching, slightly swollen and warm to the touch. I have no fevers." Explained to the patient that I will give the informtion to [facility] APP and Barbaraann Share, someone if call her today. Patient will be in a meeting today and unavailable from 9:30am to 10:30am.

## 2018-03-06 NOTE — Progress Notes (Signed)
Prednisone dose pack ordered per Dr. Denman George for probable contact dermatitis at the IV site from recent CT.  Patient feels it is from tape.

## 2018-03-06 NOTE — Telephone Encounter (Signed)
Pt able to sent picture of arm through system portal. Dr. Denman George recommended a medrol dose pack.  If redness  not improved after 2nd dose to call as an ATB may need to be prescribed. Will follow up with Stephanie Mahoney tomorrow mid day to see if redness decreasing. Cell: [phone]

## 2018-03-07 ENCOUNTER — Telehealth: Payer: Self-pay

## 2018-03-07 ENCOUNTER — Other Ambulatory Visit: Payer: Self-pay

## 2018-03-07 DIAGNOSIS — L231 Allergic contact dermatitis due to adhesives: Secondary | ICD-10-CM

## 2018-03-07 MED ORDER — CEPHALEXIN 500 MG PO CAPS: 500.0000 mg | ORAL_CAPSULE | Freq: Three times a day (TID) | ORAL | 0 refills | Status: DC

## 2018-03-07 NOTE — Telephone Encounter (Signed)
Outgoing call to patient regarding previous telephone note from [place] - "Dr Denman George recommended a medrol dose pack. If redness not improved after 2nd dose to call as a antibiotic may need to be prescribed."  No answer, left her a VM to contact our office so she can let us know how she is doing today.

## 2018-03-07 NOTE — Telephone Encounter (Signed)
Called pt per my previous telephone note: per dr Denman George "prescribe Keflex 500 mg three times a day for 7 days #21, no refills." Pt needs to continue medrol dose pack and to start on antibiotic tomorrow-mid day if still no improvement in symptoms-( to allow her taking medrol dose pack full 48 hours.) Pt voiced understanding.  No other needs per pt at this time.

## 2018-03-07 NOTE — Progress Notes (Signed)
Received VMl back from pt.  She reports her redness has not improved, there are still bumps but not itching as bad today.  Pt prefers no Omicef or Augmentin if we are prescribing antibiotics.  Notified Dr Denman George, per her she will prescribe Keflex 500 mg three times a day for 7 days #21, no refills. Pt needs to start on antibiotic tomorrow-mid day,  if still no improvement in symptoms, to allow her taking medrol dose pack full 48 hours.  Attempted to call pt back, no answer, left VM to call our office.

## 2018-08-18 DIAGNOSIS — R293 Abnormal posture: Secondary | ICD-10-CM | POA: Diagnosis not present

## 2018-08-18 DIAGNOSIS — G589 Mononeuropathy, unspecified: Secondary | ICD-10-CM | POA: Diagnosis not present

## 2018-08-18 DIAGNOSIS — M2560 Stiffness of unspecified joint, not elsewhere classified: Secondary | ICD-10-CM | POA: Diagnosis not present

## 2018-08-18 DIAGNOSIS — M62830 Muscle spasm of back: Secondary | ICD-10-CM | POA: Diagnosis not present

## 2018-09-10 ENCOUNTER — Other Ambulatory Visit: Payer: Self-pay | Admitting: Obstetrics & Gynecology

## 2018-09-10 DIAGNOSIS — Z1231 Encounter for screening mammogram for malignant neoplasm of breast: Secondary | ICD-10-CM

## 2018-10-31 ENCOUNTER — Ambulatory Visit: Payer: BLUE CROSS/BLUE SHIELD

## 2018-11-17 ENCOUNTER — Other Ambulatory Visit: Payer: Self-pay

## 2018-11-17 NOTE — Patient Outreach (Signed)
Health Risk Assessment: Late entry  11/05/2018  Placed call to patient to review Health risk assessment survey: Patient reports she is doing well and denies any needs.  PLAN: will mail successful outreach letter.  Tomasa Rand, RN, BSN, CEN [facility] [employer] [phone].

## 2018-12-03 ENCOUNTER — Other Ambulatory Visit: Payer: Self-pay

## 2018-12-10 ENCOUNTER — Ambulatory Visit: Payer: BLUE CROSS/BLUE SHIELD

## 2018-12-11 ENCOUNTER — Ambulatory Visit: Payer: BLUE CROSS/BLUE SHIELD

## 2018-12-15 DIAGNOSIS — R5383 Other fatigue: Secondary | ICD-10-CM | POA: Diagnosis not present

## 2018-12-15 DIAGNOSIS — R197 Diarrhea, unspecified: Secondary | ICD-10-CM | POA: Diagnosis not present

## 2018-12-15 DIAGNOSIS — R509 Fever, unspecified: Secondary | ICD-10-CM | POA: Diagnosis not present

## 2018-12-15 DIAGNOSIS — Z20818 Contact with and (suspected) exposure to other bacterial communicable diseases: Secondary | ICD-10-CM | POA: Diagnosis not present

## 2018-12-24 ENCOUNTER — Other Ambulatory Visit: Payer: Self-pay

## 2018-12-24 NOTE — Patient Outreach (Addendum)
Late entry:  11/05/2018 Reached patient for healthteam advantage engagement program. Denies needs.  Will place in engagement program and call in 6 months.  Successful outreach letter already sent per direction of Surveyor, quantity.  Tomasa Rand, RN, BSN, CEN [facility] [employer] [phone]

## 2019-01-01 DIAGNOSIS — Z01419 Encounter for gynecological examination (general) (routine) without abnormal findings: Secondary | ICD-10-CM | POA: Diagnosis not present

## 2019-01-01 DIAGNOSIS — Z6823 Body mass index (BMI) 23.0-23.9, adult: Secondary | ICD-10-CM | POA: Diagnosis not present

## 2019-01-09 ENCOUNTER — Other Ambulatory Visit: Payer: Self-pay

## 2019-01-09 ENCOUNTER — Ambulatory Visit
Admission: RE | Admit: 2019-01-09 | Discharge: 2019-01-09 | Disposition: A | Discharge status: Home / Self Care | Payer: PPO | Source: Ambulatory Visit | Attending: Obstetrics & Gynecology | Admitting: Obstetrics & Gynecology

## 2019-01-09 DIAGNOSIS — Z1231 Encounter for screening mammogram for malignant neoplasm of breast: Secondary | ICD-10-CM | POA: Diagnosis not present

## 2019-01-20 DIAGNOSIS — R5383 Other fatigue: Secondary | ICD-10-CM | POA: Diagnosis not present

## 2019-01-22 ENCOUNTER — Other Ambulatory Visit: Payer: Self-pay

## 2019-01-22 NOTE — Patient Outreach (Signed)
Health Team Advantage:  Case closed and care transitioned to [facility].  PLAN: close case and send MD letter. Tomasa Rand, RN, BSN, CEN [facility] [employer] [phone]

## 2019-05-07 ENCOUNTER — Ambulatory Visit: Payer: BLUE CROSS/BLUE SHIELD

## 2019-05-15 DIAGNOSIS — Z23 Encounter for immunization: Secondary | ICD-10-CM | POA: Diagnosis not present

## 2019-05-25 DIAGNOSIS — R8290 Unspecified abnormal findings in urine: Secondary | ICD-10-CM | POA: Diagnosis not present

## 2019-05-25 DIAGNOSIS — B373 Candidiasis of vulva and vagina: Secondary | ICD-10-CM | POA: Diagnosis not present

## 2019-06-24 ENCOUNTER — Other Ambulatory Visit: Payer: Self-pay

## 2019-06-24 ENCOUNTER — Inpatient Hospital Stay: Payer: PPO | Attending: Gynecologic Oncology | Admitting: Gynecologic Oncology

## 2019-06-24 ENCOUNTER — Encounter: Payer: Self-pay | Admitting: Gynecologic Oncology

## 2019-06-24 VITALS — BP 101/60 | HR 93 | Temp 97.8°F | Resp 17 | Ht 64.0 in | Wt 133.2 lb

## 2019-06-24 DIAGNOSIS — H9319 Tinnitus, unspecified ear: Secondary | ICD-10-CM | POA: Diagnosis not present

## 2019-06-24 DIAGNOSIS — I10 Essential (primary) hypertension: Secondary | ICD-10-CM | POA: Diagnosis not present

## 2019-06-24 DIAGNOSIS — M1990 Unspecified osteoarthritis, unspecified site: Secondary | ICD-10-CM | POA: Insufficient documentation

## 2019-06-24 DIAGNOSIS — M419 Scoliosis, unspecified: Secondary | ICD-10-CM | POA: Insufficient documentation

## 2019-06-24 DIAGNOSIS — N993 Prolapse of vaginal vault after hysterectomy: Secondary | ICD-10-CM

## 2019-06-24 DIAGNOSIS — Z90710 Acquired absence of both cervix and uterus: Secondary | ICD-10-CM

## 2019-06-24 DIAGNOSIS — I341 Nonrheumatic mitral (valve) prolapse: Secondary | ICD-10-CM | POA: Insufficient documentation

## 2019-06-24 DIAGNOSIS — Z90722 Acquired absence of ovaries, bilateral: Secondary | ICD-10-CM | POA: Insufficient documentation

## 2019-06-24 DIAGNOSIS — C541 Malignant neoplasm of endometrium: Secondary | ICD-10-CM | POA: Insufficient documentation

## 2019-06-24 DIAGNOSIS — I7300 Raynaud's syndrome without gangrene: Secondary | ICD-10-CM | POA: Insufficient documentation

## 2019-06-24 NOTE — Patient Instructions (Signed)
Please notify Dr Denman George at phone number [phone] if you notice vaginal bleeding, new pelvic or abdominal pains, bloating, feeling full easy, or a change in bladder or bowel function.   Please contact Dr Serita Grit office (at [phone]) in July, 2021 to request an appointment with her for November, 2021.
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
## 2019-07-15 DIAGNOSIS — N8110 Cystocele, unspecified: Secondary | ICD-10-CM | POA: Diagnosis not present

## 2019-07-15 DIAGNOSIS — N3946 Mixed incontinence: Secondary | ICD-10-CM | POA: Diagnosis not present

## 2019-07-15 DIAGNOSIS — N812 Incomplete uterovaginal prolapse: Secondary | ICD-10-CM | POA: Diagnosis not present

## 2019-08-04 DIAGNOSIS — B373 Candidiasis of vulva and vagina: Secondary | ICD-10-CM | POA: Diagnosis not present

## 2019-08-10 DIAGNOSIS — M542 Cervicalgia: Secondary | ICD-10-CM | POA: Diagnosis not present

## 2019-08-10 DIAGNOSIS — M4125 Other idiopathic scoliosis, thoracolumbar region: Secondary | ICD-10-CM | POA: Diagnosis not present

## 2019-08-11 DIAGNOSIS — E7849 Other hyperlipidemia: Secondary | ICD-10-CM | POA: Diagnosis not present

## 2019-08-11 DIAGNOSIS — M859 Disorder of bone density and structure, unspecified: Secondary | ICD-10-CM | POA: Diagnosis not present

## 2019-08-14 DIAGNOSIS — K8020 Calculus of gallbladder without cholecystitis without obstruction: Secondary | ICD-10-CM | POA: Diagnosis not present

## 2019-08-14 DIAGNOSIS — R3121 Asymptomatic microscopic hematuria: Secondary | ICD-10-CM | POA: Diagnosis not present

## 2019-08-14 DIAGNOSIS — C55 Malignant neoplasm of uterus, part unspecified: Secondary | ICD-10-CM | POA: Diagnosis not present

## 2019-08-14 DIAGNOSIS — E785 Hyperlipidemia, unspecified: Secondary | ICD-10-CM | POA: Diagnosis not present

## 2019-08-14 DIAGNOSIS — H6123 Impacted cerumen, bilateral: Secondary | ICD-10-CM | POA: Diagnosis not present

## 2019-08-14 DIAGNOSIS — I7300 Raynaud's syndrome without gangrene: Secondary | ICD-10-CM | POA: Diagnosis not present

## 2019-08-14 DIAGNOSIS — M542 Cervicalgia: Secondary | ICD-10-CM | POA: Diagnosis not present

## 2019-08-14 DIAGNOSIS — H9190 Unspecified hearing loss, unspecified ear: Secondary | ICD-10-CM | POA: Diagnosis not present

## 2019-08-14 DIAGNOSIS — M419 Scoliosis, unspecified: Secondary | ICD-10-CM | POA: Diagnosis not present

## 2019-08-14 DIAGNOSIS — M8580 Other specified disorders of bone density and structure, unspecified site: Secondary | ICD-10-CM | POA: Diagnosis not present

## 2019-08-14 DIAGNOSIS — Z0000 Encounter for general adult medical examination without abnormal findings: Secondary | ICD-10-CM | POA: Diagnosis not present

## 2019-08-17 ENCOUNTER — Other Ambulatory Visit: Payer: Self-pay | Admitting: Internal Medicine

## 2019-08-17 DIAGNOSIS — E785 Hyperlipidemia, unspecified: Secondary | ICD-10-CM

## 2019-08-25 DIAGNOSIS — R82998 Other abnormal findings in urine: Secondary | ICD-10-CM | POA: Diagnosis not present

## 2019-09-04 DIAGNOSIS — Z1212 Encounter for screening for malignant neoplasm of rectum: Secondary | ICD-10-CM | POA: Diagnosis not present

## 2019-09-15 ENCOUNTER — Other Ambulatory Visit: Payer: PPO

## 2019-09-18 ENCOUNTER — Other Ambulatory Visit: Payer: PPO

## 2019-10-14 DIAGNOSIS — N819 Female genital prolapse, unspecified: Secondary | ICD-10-CM | POA: Diagnosis not present

## 2019-10-14 DIAGNOSIS — N760 Acute vaginitis: Secondary | ICD-10-CM | POA: Diagnosis not present

## 2019-11-04 DIAGNOSIS — N760 Acute vaginitis: Secondary | ICD-10-CM | POA: Diagnosis not present

## 2019-11-04 DIAGNOSIS — N819 Female genital prolapse, unspecified: Secondary | ICD-10-CM | POA: Diagnosis not present

## 2019-11-04 DIAGNOSIS — R32 Unspecified urinary incontinence: Secondary | ICD-10-CM | POA: Diagnosis not present

## 2019-11-18 DIAGNOSIS — M8589 Other specified disorders of bone density and structure, multiple sites: Secondary | ICD-10-CM | POA: Diagnosis not present

## 2019-12-09 ENCOUNTER — Other Ambulatory Visit: Payer: Self-pay | Admitting: Obstetrics & Gynecology

## 2019-12-09 DIAGNOSIS — N814 Uterovaginal prolapse, unspecified: Secondary | ICD-10-CM | POA: Diagnosis not present

## 2019-12-09 DIAGNOSIS — Z1231 Encounter for screening mammogram for malignant neoplasm of breast: Secondary | ICD-10-CM

## 2019-12-09 DIAGNOSIS — N760 Acute vaginitis: Secondary | ICD-10-CM | POA: Diagnosis not present

## 2020-01-07 DIAGNOSIS — Z6823 Body mass index (BMI) 23.0-23.9, adult: Secondary | ICD-10-CM | POA: Diagnosis not present

## 2020-01-07 DIAGNOSIS — Z124 Encounter for screening for malignant neoplasm of cervix: Secondary | ICD-10-CM | POA: Diagnosis not present

## 2020-01-15 ENCOUNTER — Other Ambulatory Visit: Payer: Self-pay

## 2020-01-15 ENCOUNTER — Ambulatory Visit
Admission: RE | Admit: 2020-01-15 | Discharge: 2020-01-15 | Disposition: A | Discharge status: Home / Self Care | Payer: PPO | Source: Ambulatory Visit | Attending: Obstetrics & Gynecology | Admitting: Obstetrics & Gynecology

## 2020-01-15 DIAGNOSIS — Z1231 Encounter for screening mammogram for malignant neoplasm of breast: Secondary | ICD-10-CM | POA: Diagnosis not present

## 2020-01-27 DIAGNOSIS — H6123 Impacted cerumen, bilateral: Secondary | ICD-10-CM | POA: Diagnosis not present

## 2020-02-01 DIAGNOSIS — H903 Sensorineural hearing loss, bilateral: Secondary | ICD-10-CM | POA: Diagnosis not present

## 2020-02-17 DIAGNOSIS — H903 Sensorineural hearing loss, bilateral: Secondary | ICD-10-CM | POA: Diagnosis not present

## 2020-03-09 DIAGNOSIS — N813 Complete uterovaginal prolapse: Secondary | ICD-10-CM | POA: Diagnosis not present

## 2020-03-16 ENCOUNTER — Other Ambulatory Visit: Payer: PPO

## 2020-03-22 ENCOUNTER — Ambulatory Visit
Admission: RE | Admit: 2020-03-22 | Discharge: 2020-03-22 | Disposition: A | Discharge status: Home / Self Care | Payer: No Typology Code available for payment source | Source: Ambulatory Visit | Attending: Internal Medicine | Admitting: Internal Medicine

## 2020-03-22 DIAGNOSIS — E785 Hyperlipidemia, unspecified: Secondary | ICD-10-CM

## 2020-05-06 DIAGNOSIS — M546 Pain in thoracic spine: Secondary | ICD-10-CM | POA: Diagnosis not present

## 2020-05-06 DIAGNOSIS — M4125 Other idiopathic scoliosis, thoracolumbar region: Secondary | ICD-10-CM | POA: Diagnosis not present

## 2020-05-06 DIAGNOSIS — M4004 Postural kyphosis, thoracic region: Secondary | ICD-10-CM | POA: Diagnosis not present

## 2020-05-06 DIAGNOSIS — M25512 Pain in left shoulder: Secondary | ICD-10-CM | POA: Diagnosis not present

## 2020-05-06 HISTORY — PX: ROBOTIC ASSISTED LAPAROSCOPIC SACROCOLPOPEXY: SHX5388

## 2020-05-13 DIAGNOSIS — M2560 Stiffness of unspecified joint, not elsewhere classified: Secondary | ICD-10-CM | POA: Diagnosis not present

## 2020-05-13 DIAGNOSIS — R531 Weakness: Secondary | ICD-10-CM | POA: Diagnosis not present

## 2020-05-17 DIAGNOSIS — R531 Weakness: Secondary | ICD-10-CM | POA: Diagnosis not present

## 2020-05-17 DIAGNOSIS — M2560 Stiffness of unspecified joint, not elsewhere classified: Secondary | ICD-10-CM | POA: Diagnosis not present

## 2020-05-20 DIAGNOSIS — R531 Weakness: Secondary | ICD-10-CM | POA: Diagnosis not present

## 2020-05-20 DIAGNOSIS — M2560 Stiffness of unspecified joint, not elsewhere classified: Secondary | ICD-10-CM | POA: Diagnosis not present

## 2020-05-24 DIAGNOSIS — R531 Weakness: Secondary | ICD-10-CM | POA: Diagnosis not present

## 2020-05-24 DIAGNOSIS — M2560 Stiffness of unspecified joint, not elsewhere classified: Secondary | ICD-10-CM | POA: Diagnosis not present

## 2020-05-26 DIAGNOSIS — R531 Weakness: Secondary | ICD-10-CM | POA: Diagnosis not present

## 2020-05-26 DIAGNOSIS — M2560 Stiffness of unspecified joint, not elsewhere classified: Secondary | ICD-10-CM | POA: Diagnosis not present

## 2020-05-30 DIAGNOSIS — N812 Incomplete uterovaginal prolapse: Secondary | ICD-10-CM | POA: Diagnosis not present

## 2020-05-30 DIAGNOSIS — N8189 Other female genital prolapse: Secondary | ICD-10-CM | POA: Diagnosis not present

## 2020-05-30 DIAGNOSIS — N813 Complete uterovaginal prolapse: Secondary | ICD-10-CM | POA: Diagnosis not present

## 2020-06-13 DIAGNOSIS — M2560 Stiffness of unspecified joint, not elsewhere classified: Secondary | ICD-10-CM | POA: Diagnosis not present

## 2020-06-13 DIAGNOSIS — R531 Weakness: Secondary | ICD-10-CM | POA: Diagnosis not present

## 2020-06-16 DIAGNOSIS — R531 Weakness: Secondary | ICD-10-CM | POA: Diagnosis not present

## 2020-06-16 DIAGNOSIS — M2560 Stiffness of unspecified joint, not elsewhere classified: Secondary | ICD-10-CM | POA: Diagnosis not present

## 2020-06-20 DIAGNOSIS — R531 Weakness: Secondary | ICD-10-CM | POA: Diagnosis not present

## 2020-06-20 DIAGNOSIS — M2560 Stiffness of unspecified joint, not elsewhere classified: Secondary | ICD-10-CM | POA: Diagnosis not present

## 2020-06-28 DIAGNOSIS — R531 Weakness: Secondary | ICD-10-CM | POA: Diagnosis not present

## 2020-06-28 DIAGNOSIS — M2560 Stiffness of unspecified joint, not elsewhere classified: Secondary | ICD-10-CM | POA: Diagnosis not present

## 2020-07-11 ENCOUNTER — Other Ambulatory Visit: Payer: Self-pay

## 2020-07-11 ENCOUNTER — Other Ambulatory Visit: Payer: Self-pay | Admitting: Internal Medicine

## 2020-07-11 ENCOUNTER — Other Ambulatory Visit (HOSPITAL_COMMUNITY): Payer: Self-pay | Admitting: Internal Medicine

## 2020-07-11 ENCOUNTER — Ambulatory Visit (INDEPENDENT_AMBULATORY_CARE_PROVIDER_SITE_OTHER): Payer: PPO

## 2020-07-11 DIAGNOSIS — R109 Unspecified abdominal pain: Secondary | ICD-10-CM

## 2020-07-11 DIAGNOSIS — N281 Cyst of kidney, acquired: Secondary | ICD-10-CM

## 2020-07-11 DIAGNOSIS — N390 Urinary tract infection, site not specified: Secondary | ICD-10-CM | POA: Diagnosis not present

## 2020-07-11 DIAGNOSIS — Z8542 Personal history of malignant neoplasm of other parts of uterus: Secondary | ICD-10-CM | POA: Diagnosis not present

## 2020-07-11 DIAGNOSIS — K8020 Calculus of gallbladder without cholecystitis without obstruction: Secondary | ICD-10-CM | POA: Diagnosis not present

## 2020-07-11 DIAGNOSIS — M419 Scoliosis, unspecified: Secondary | ICD-10-CM | POA: Diagnosis not present

## 2020-07-25 DIAGNOSIS — M5440 Lumbago with sciatica, unspecified side: Secondary | ICD-10-CM | POA: Diagnosis not present

## 2020-07-25 DIAGNOSIS — M6281 Muscle weakness (generalized): Secondary | ICD-10-CM | POA: Diagnosis not present

## 2020-07-28 DIAGNOSIS — M6281 Muscle weakness (generalized): Secondary | ICD-10-CM | POA: Diagnosis not present

## 2020-07-28 DIAGNOSIS — M5440 Lumbago with sciatica, unspecified side: Secondary | ICD-10-CM | POA: Diagnosis not present

## 2020-08-01 DIAGNOSIS — M6281 Muscle weakness (generalized): Secondary | ICD-10-CM | POA: Diagnosis not present

## 2020-08-01 DIAGNOSIS — M5440 Lumbago with sciatica, unspecified side: Secondary | ICD-10-CM | POA: Diagnosis not present

## 2020-08-18 DIAGNOSIS — Z1211 Encounter for screening for malignant neoplasm of colon: Secondary | ICD-10-CM | POA: Diagnosis not present

## 2020-08-18 DIAGNOSIS — Z1212 Encounter for screening for malignant neoplasm of rectum: Secondary | ICD-10-CM | POA: Diagnosis not present

## 2020-08-19 DIAGNOSIS — M5440 Lumbago with sciatica, unspecified side: Secondary | ICD-10-CM | POA: Diagnosis not present

## 2020-08-19 DIAGNOSIS — M6281 Muscle weakness (generalized): Secondary | ICD-10-CM | POA: Diagnosis not present

## 2020-08-25 DIAGNOSIS — M6281 Muscle weakness (generalized): Secondary | ICD-10-CM | POA: Diagnosis not present

## 2020-08-25 DIAGNOSIS — M5440 Lumbago with sciatica, unspecified side: Secondary | ICD-10-CM | POA: Diagnosis not present

## 2020-08-26 DIAGNOSIS — M859 Disorder of bone density and structure, unspecified: Secondary | ICD-10-CM | POA: Diagnosis not present

## 2020-08-26 DIAGNOSIS — E785 Hyperlipidemia, unspecified: Secondary | ICD-10-CM | POA: Diagnosis not present

## 2020-08-29 LAB — COLOGUARD: COLOGUARD: POSITIVE — AB

## 2020-09-01 ENCOUNTER — Telehealth: Payer: Self-pay | Admitting: *Deleted

## 2020-09-01 NOTE — Telephone Encounter (Signed)
Patient called and scheduled a follow up appt for March  

## 2020-09-02 DIAGNOSIS — I7300 Raynaud's syndrome without gangrene: Secondary | ICD-10-CM | POA: Diagnosis not present

## 2020-09-02 DIAGNOSIS — M419 Scoliosis, unspecified: Secondary | ICD-10-CM | POA: Diagnosis not present

## 2020-09-02 DIAGNOSIS — Z0000 Encounter for general adult medical examination without abnormal findings: Secondary | ICD-10-CM | POA: Diagnosis not present

## 2020-09-02 DIAGNOSIS — M545 Low back pain, unspecified: Secondary | ICD-10-CM | POA: Diagnosis not present

## 2020-09-02 DIAGNOSIS — M8589 Other specified disorders of bone density and structure, multiple sites: Secondary | ICD-10-CM | POA: Diagnosis not present

## 2020-09-02 DIAGNOSIS — E785 Hyperlipidemia, unspecified: Secondary | ICD-10-CM | POA: Diagnosis not present

## 2020-09-02 DIAGNOSIS — R82998 Other abnormal findings in urine: Secondary | ICD-10-CM | POA: Diagnosis not present

## 2020-09-14 DIAGNOSIS — D3131 Benign neoplasm of right choroid: Secondary | ICD-10-CM | POA: Diagnosis not present

## 2020-09-14 DIAGNOSIS — Z961 Presence of intraocular lens: Secondary | ICD-10-CM | POA: Diagnosis not present

## 2020-09-14 DIAGNOSIS — N390 Urinary tract infection, site not specified: Secondary | ICD-10-CM | POA: Diagnosis not present

## 2020-09-14 DIAGNOSIS — R300 Dysuria: Secondary | ICD-10-CM | POA: Diagnosis not present

## 2020-09-14 DIAGNOSIS — H04123 Dry eye syndrome of bilateral lacrimal glands: Secondary | ICD-10-CM | POA: Diagnosis not present

## 2020-09-16 DIAGNOSIS — R195 Other fecal abnormalities: Secondary | ICD-10-CM | POA: Diagnosis not present

## 2020-09-21 DIAGNOSIS — M6281 Muscle weakness (generalized): Secondary | ICD-10-CM | POA: Diagnosis not present

## 2020-09-21 DIAGNOSIS — M5440 Lumbago with sciatica, unspecified side: Secondary | ICD-10-CM | POA: Diagnosis not present

## 2020-09-28 DIAGNOSIS — Z01812 Encounter for preprocedural laboratory examination: Secondary | ICD-10-CM | POA: Diagnosis not present

## 2020-10-03 DIAGNOSIS — K635 Polyp of colon: Secondary | ICD-10-CM | POA: Diagnosis not present

## 2020-10-03 DIAGNOSIS — R195 Other fecal abnormalities: Secondary | ICD-10-CM | POA: Diagnosis not present

## 2020-10-03 DIAGNOSIS — D122 Benign neoplasm of ascending colon: Secondary | ICD-10-CM | POA: Diagnosis not present

## 2020-10-05 ENCOUNTER — Encounter: Payer: Self-pay | Admitting: Gynecologic Oncology

## 2020-10-05 DIAGNOSIS — M5440 Lumbago with sciatica, unspecified side: Secondary | ICD-10-CM | POA: Diagnosis not present

## 2020-10-05 DIAGNOSIS — K635 Polyp of colon: Secondary | ICD-10-CM | POA: Diagnosis not present

## 2020-10-05 DIAGNOSIS — D122 Benign neoplasm of ascending colon: Secondary | ICD-10-CM | POA: Diagnosis not present

## 2020-10-05 DIAGNOSIS — M6281 Muscle weakness (generalized): Secondary | ICD-10-CM | POA: Diagnosis not present

## 2020-10-05 NOTE — Progress Notes (Signed)
. Follow-up Note: Gyn-Onc New Patient Consultation  Stephanie Mahoney 70 y.o. female  CC:  Chief Complaint  Patient presents with  . Endometrial cancer [facility])    Assessment/Plan: 70 year old with history of a stage IA grade 1 endometrioid adenocarcinoma (MSI stable, MMR normal) s/p robotic assisted staging with hysterectomy on 08/27/17. Pathology revealed low risk factors for recurrence, therefore no adjuvant therapy was recommended according to NCCN guidelines.  1/ vaginal prolapse: seeing Maryland Pink. S/p sacrocolpopexy  2/ Endometrial cancer: no evidence of recurrent disease. She will see Dr Nori Riis in 6 months and myself in 12 months.   HPI: Patient is seen today in consultation at the request of Dr. Nori Riis for newly diagnosed endometrial cancer. Primary physician: Dr. Crist Infante.  Patient is a 70 year old gravida 4 para 3 whohad been on hormone replacement therapy for approximately 10 years. She had normal Pap smear in December 2017. She had been on a Vivelle 0.1 patch as well as Prometrium 100 mg. The bleeding was different was the spotting that she had in November 2017 and lasted longer than it usually date and this is what led to her to follow-up with Dr. Nori Riis.  She had an ultrasound on 06/20/2017. It revealed the uterus to be 9.8 x 5.3 x 6.1 cm. However, should a 2.24 cm endometrial thickness with increased blood flow. She underwent a D&C on November 29 that revealed a grade 1 endometrial cancer.  On 08/27/17 she underwent robotic assisted total hysterectomy, BSO, SLN biopsy. Final pathology revealed a 4.5cm tumor which was grade 1 endometrioid histology. There was inner half (0.4 of 2.2cm) myometrial invasion. LVSI was absent. 2 left pelvic SLN's were negative for metastatic disease. The right pelvic SLN contained only fibroadipose tissue (it was undyed on mapping). MSI stable, MMR normal. She was determined to have low risk disease and in accordance with NCCN guidelines no  further adjuvant therapy was recommended.  When I saw the patient in July 2019 she reported pelvic pressure and prolapse.  I ordered a CT scan of the abdomen and pelvis at that time to rule out a space-occupying lesion such as recurrence as the cause of this.  The CT scan was negative for recurrence.  She was referred to Dr. Maryland Pink for treatment of vaginal vault prolapse and prescribed a pessary at that time.  On February 24, 2018 a biopsy was performed of the vaginal cuff consistent with granulation tissue.   Interval Hx:  She has been following up with Dr. Zigmund Daniel for her vaginal vault prolapse.  She underwent robotic sacrocolpopexy in October, 2021. This substantially improved symptoms.   She has no symptoms concerning for recurrence.  Review of Systems: Constitutional: Denies fever. Skin: No rash Cardiovascular: No chest pain, shortness of breath, or edema  Pulmonary: No cough  Gastro Intestinal: No nausea, vomiting, constipation, or diarrhea reported.  Genitourinary: no vaginal bleeding,  Musculoskeletal: + scoliosis, no pain Psychology: No concerns  Current Meds:  Outpatient Encounter Medications as of 10/06/2020  Medication Sig  . acetaminophen (TYLENOL) 500 MG tablet Take 500 mg by mouth every 6 (six) hours as needed for moderate pain or headache.  . Calcium Carbonate-Vitamin D (CALCIUM-VITAMIN D3 PO) Take 1 tablet by mouth daily.  . Cholecalciferol 100 MCG (4000 UT) CAPS Take 4,000 Units by mouth daily.  . cyclobenzaprine (FLEXERIL) 5 MG tablet Take 5 mg by mouth 3 (three) times daily as needed for muscle spasms.   Marland Kitchen estradiol (ESTRACE) 0.1 MG/GM vaginal cream Use berry sized  amount nightly PV x 2 weeks then QOD.  Marland Kitchen estradiol (VIVELLE-DOT) 0.1 MG/24HR patch Place 0.5 patches onto the skin 2 (two) times a week.  . ibandronate (BONIVA) 150 MG tablet Take 150 mg by mouth every 30 (thirty) days. Take in the morning with a full glass of water, on an empty stomach, and do not  take anything else by mouth or lie down for the next 30 min.  Loma [place] Calcium 500 MG TABS Take 500 mg by mouth daily.   No facility-administered encounter medications on file as of 10/06/2020.    Allergy:  Allergies  Allergen Reactions  . Lanolin Itching    Social Hx:   Social History   Socioeconomic History  . Marital status: Married    Spouse name: Not on file  . Number of children: Not on file  . Years of education: Not on file  . Highest education level: Not on file  Occupational History  . Not on file  Tobacco Use  . Smoking status: Never Smoker  . Smokeless tobacco: Never Used  Vaping Use  . Vaping Use: Never used  Substance and Sexual Activity  . Alcohol use: Yes    Comment: 2-3 glasses of wine on the weekend, no more than 1 a day  . Drug use: No  . Sexual activity: Not on file  Other Topics Concern  . Not on file  Social History Narrative  . Not on file   Social Determinants of Health   Financial Resource Strain: Not on file  Food Insecurity: Not on file  Transportation Needs: Not on file  Physical Activity: Not on file  Stress: Not on file  Social Connections: Not on file  Intimate Partner Violence: Not on file    Past Surgical Hx:  Past Surgical History:  Procedure Laterality Date  . bilateral cataract surgery     . DILATION AND CURETTAGE OF UTERUS     recently and 3 yrs ago  . LYMPH NODE BIOPSY Bilateral 08/27/2017   Procedure: SENTINEL LYMPH NODE BIOPSY;  Surgeon: Everitt Amber, MD;  Location: [facility];  Service: Gynecology;  Laterality: Bilateral;  . ROBOTIC ASSISTED LAPAROSCOPIC SACROCOLPOPEXY  05/2020   Dr. Jacki Cones  . ROBOTIC ASSISTED TOTAL HYSTERECTOMY WITH BILATERAL SALPINGO OOPHERECTOMY Bilateral 08/27/2017   Procedure: XI ROBOTIC ASSISTED TOTAL HYSTERECTOMY WITH BILATERAL SALPINGO OOPHORECTOMY;  Surgeon: Everitt Amber, MD;  Location: [facility];  Service: Gynecology;  Laterality: Bilateral;  . SPINAL FUSION  1966    Past Medical  Hx:  Past Medical History:  Diagnosis Date  . Arthritis    thumbs   . Mitral valve prolapse   . Pneumonia    hx of 30 years ago   . Postmenopausal bleeding   . Raynaud's disease    mostly in hands   . Raynaud's disease   . Scoliosis   . Tinnitus   . Uterine cancer ([place])   . Vaginal delivery    x3    Oncology Hx:  Oncology History  Endometrial ca [facility])  07/04/2017 Initial Diagnosis   Endometrial ca (HCC)-grade 1     Family Hx:  Family History  Problem Relation Age of Onset  . Osteoporosis Mother   . BRCA 1/2 Mother   . Hypertension Mother   . CVA Mother   . Breast cancer Mother 36  . Hypertension Father   . CVA Father   . Cancer Sister        in leg  . Scleroderma Sister   .  Osteoporosis Sister   . Osteoporosis Sister   . Hypertension Sister     Vitals:  Blood pressure 107/67, pulse 97, temperature 98.3 F (36.8 C), temperature source Tympanic, resp. rate 16, height '5\' 4"'  (1.626 m), weight 138 lb 8 oz (62.8 kg), SpO2 100 %.  Physical Exam: Well-nourished well-developed female in no acute distress.  Neck: Supple, no lymphadenopathy, no thyromegaly.  Lungs: Clear to auscultation bilaterally. Significant kyphoscoliosis.  Cardiac: Regular rate and rhythm.  Abdomen: Soft, nontender, nondistended. There are no palpable masses or hepatosplenomegaly.  Groins: No lymphadenopathy.  Extremity: No edema.  Pelvic: External genitalia within normal limits though atrophic. The vagina is atrophic. There is permanent suture present at the vaginal apex with good suspension of the vault. No visible or palpable recurrent cancer   Thereasa Solo, MD 10/06/2020, 2:08 PM

## 2020-10-06 ENCOUNTER — Encounter: Payer: Self-pay | Admitting: Gynecologic Oncology

## 2020-10-06 ENCOUNTER — Other Ambulatory Visit: Payer: Self-pay

## 2020-10-06 ENCOUNTER — Inpatient Hospital Stay: Payer: PPO | Attending: Gynecologic Oncology | Admitting: Gynecologic Oncology

## 2020-10-06 VITALS — BP 107/67 | HR 97 | Temp 98.3°F | Resp 16 | Ht 64.0 in | Wt 138.5 lb

## 2020-10-06 DIAGNOSIS — N993 Prolapse of vaginal vault after hysterectomy: Secondary | ICD-10-CM | POA: Diagnosis not present

## 2020-10-06 DIAGNOSIS — Z79890 Hormone replacement therapy (postmenopausal): Secondary | ICD-10-CM | POA: Diagnosis not present

## 2020-10-06 DIAGNOSIS — Z90722 Acquired absence of ovaries, bilateral: Secondary | ICD-10-CM | POA: Diagnosis not present

## 2020-10-06 DIAGNOSIS — M419 Scoliosis, unspecified: Secondary | ICD-10-CM | POA: Diagnosis not present

## 2020-10-06 DIAGNOSIS — Z90710 Acquired absence of both cervix and uterus: Secondary | ICD-10-CM | POA: Insufficient documentation

## 2020-10-06 DIAGNOSIS — I7300 Raynaud's syndrome without gangrene: Secondary | ICD-10-CM | POA: Insufficient documentation

## 2020-10-06 DIAGNOSIS — C541 Malignant neoplasm of endometrium: Secondary | ICD-10-CM

## 2020-10-06 DIAGNOSIS — I341 Nonrheumatic mitral (valve) prolapse: Secondary | ICD-10-CM | POA: Insufficient documentation

## 2020-10-06 NOTE — Patient Instructions (Signed)
Please notify Dr Denman George at phone number [phone] if you notice vaginal bleeding, new pelvic or abdominal pains, bloating, feeling full easy, or a change in bladder or bowel function.   Dr Denman George recommends the following OBGYN's: Dr Bobbye Charleston - (ph) [phone] Dr Paula Compton - (ph) [phone]  Please follow-up with Dr Nori Riis in 6 months.  Please contact Dr Serita Grit office (at [phone]) in October to request an appointment with her for March, 2023.

## 2020-10-12 DIAGNOSIS — M5440 Lumbago with sciatica, unspecified side: Secondary | ICD-10-CM | POA: Diagnosis not present

## 2020-10-12 DIAGNOSIS — M6281 Muscle weakness (generalized): Secondary | ICD-10-CM | POA: Diagnosis not present

## 2020-12-01 ENCOUNTER — Other Ambulatory Visit: Payer: Self-pay | Admitting: Obstetrics & Gynecology

## 2020-12-01 DIAGNOSIS — Z1231 Encounter for screening mammogram for malignant neoplasm of breast: Secondary | ICD-10-CM

## 2021-01-10 DIAGNOSIS — S81859A Open bite, unspecified lower leg, initial encounter: Secondary | ICD-10-CM | POA: Diagnosis not present

## 2021-01-10 DIAGNOSIS — W540XXA Bitten by dog, initial encounter: Secondary | ICD-10-CM | POA: Diagnosis not present

## 2021-01-15 ENCOUNTER — Encounter (HOSPITAL_COMMUNITY): Payer: Self-pay | Admitting: Pharmacy Technician

## 2021-01-15 ENCOUNTER — Emergency Department (HOSPITAL_BASED_OUTPATIENT_CLINIC_OR_DEPARTMENT_OTHER): Payer: PPO

## 2021-01-15 ENCOUNTER — Encounter (HOSPITAL_BASED_OUTPATIENT_CLINIC_OR_DEPARTMENT_OTHER): Payer: Self-pay | Admitting: Emergency Medicine

## 2021-01-15 ENCOUNTER — Other Ambulatory Visit: Payer: Self-pay

## 2021-01-15 ENCOUNTER — Emergency Department (HOSPITAL_BASED_OUTPATIENT_CLINIC_OR_DEPARTMENT_OTHER)
Admission: EM | Admit: 2021-01-15 | Discharge: 2021-01-15 | Disposition: A | Discharge status: Home / Self Care | Payer: PPO | Attending: Emergency Medicine | Admitting: Emergency Medicine

## 2021-01-15 ENCOUNTER — Emergency Department (HOSPITAL_COMMUNITY): Payer: PPO

## 2021-01-15 DIAGNOSIS — R42 Dizziness and giddiness: Secondary | ICD-10-CM | POA: Insufficient documentation

## 2021-01-15 DIAGNOSIS — R110 Nausea: Secondary | ICD-10-CM | POA: Insufficient documentation

## 2021-01-15 DIAGNOSIS — H5500 Unspecified nystagmus: Secondary | ICD-10-CM | POA: Insufficient documentation

## 2021-01-15 DIAGNOSIS — Z8542 Personal history of malignant neoplasm of other parts of uterus: Secondary | ICD-10-CM | POA: Diagnosis not present

## 2021-01-15 LAB — BASIC METABOLIC PANEL
Anion gap: 6 (ref 5–15)
BUN: 19 mg/dL (ref 8–23)
CO2: 29 mmol/L (ref 22–32)
Calcium: 9.1 mg/dL (ref 8.9–10.3)
Chloride: 104 mmol/L (ref 98–111)
Creatinine, Ser: 0.55 mg/dL (ref 0.44–1.00)
GFR, Estimated: >60 mL/min (ref >60)
Glucose, Bld: 145 mg/dL — ABNORMAL HIGH (ref 70–99)
Potassium: 3.9 mmol/L (ref 3.5–5.1)
Sodium: 139 mmol/L (ref 135–145)

## 2021-01-15 LAB — CBC WITH DIFFERENTIAL/PLATELET
Abs Immature Granulocytes: 0.03 10*3/uL (ref 0.00–0.07)
Basophils Absolute: 0 10*3/uL (ref 0.0–0.1)
Basophils Relative: 0 %
Eosinophils Absolute: 0.2 10*3/uL (ref 0.0–0.5)
Eosinophils Relative: 2 %
HCT: 38.7 % (ref 36.0–46.0)
Hemoglobin: 13 g/dL (ref 12.0–15.0)
Immature Granulocytes: 0 %
Lymphocytes Relative: 16 %
Lymphs Abs: 1.2 10*3/uL (ref 0.7–4.0)
MCH: 29.1 pg (ref 26.0–34.0)
MCHC: 33.6 g/dL (ref 30.0–36.0)
MCV: 86.6 fL (ref 80.0–100.0)
Monocytes Absolute: 0.5 10*3/uL (ref 0.1–1.0)
Monocytes Relative: 7 %
Neutro Abs: 5.6 10*3/uL (ref 1.7–7.7)
Neutrophils Relative %: 75 %
Platelets: 191 10*3/uL (ref 150–400)
RBC: 4.47 MIL/uL (ref 3.87–5.11)
RDW: 13.2 % (ref 11.5–15.5)
WBC: 7.5 10*3/uL (ref 4.0–10.5)
nRBC: 0 % (ref 0.0–0.2)

## 2021-01-15 MED ORDER — ONDANSETRON 4 MG PO TBDP
4.0000 mg | ORAL_TABLET | Freq: Three times a day (TID) | ORAL | 0 refills | Status: AC | PRN
Start: 2021-01-15 — End: ?

## 2021-01-15 MED ORDER — MECLIZINE HCL 25 MG PO TABS: 25.0000 mg | ORAL_TABLET | Freq: Three times a day (TID) | ORAL | 0 refills | Status: AC | PRN

## 2021-01-15 MED ORDER — MECLIZINE HCL 25 MG PO TABS
25.0000 mg | ORAL_TABLET | Freq: Once | ORAL | Status: AC
  Administered 2021-01-15: 25 mg via ORAL
  Filled 2021-01-15: qty 1

## 2021-01-15 MED ORDER — SODIUM CHLORIDE 0.9 % IV BOLUS
1000.0000 mL | Freq: Once | INTRAVENOUS | Status: AC
  Administered 2021-01-15: 1000 mL via INTRAVENOUS

## 2021-01-15 NOTE — ED Triage Notes (Signed)
Pt sent here from med center for MRI, states already had MRI today.

## 2021-01-15 NOTE — ED Notes (Signed)
Handoff report given to [facility] RN

## 2021-01-15 NOTE — ED Triage Notes (Signed)
Woke up with dizziness, sweating . Denies pain. Has had 2 loose stools

## 2021-01-15 NOTE — ED Notes (Signed)
Warm blankets given to patient.

## 2021-01-15 NOTE — ED Provider Notes (Signed)
Pt transferred from [facility] for a MRI to r/o CVA due to dizziness.  Pt's sx are feeling much better.  She is able to ambulate without problems.  MRI neg for anything acute.  She is stable for d/c.  Return if worse.   Isla Pence, MD 01/15/21 1511

## 2021-01-15 NOTE — ED Notes (Signed)
Patient transported to CT 

## 2021-01-15 NOTE — ED Provider Notes (Signed)
[facility] Provider Note   CSN: [id number] Arrival date & time: 01/15/21  2595     History Chief Complaint  Patient presents with   Dizziness    Stephanie Mahoney is a 70 y.o. female.  HPI 70 year old female presents with acute dizziness and nausea.  She states that this started at 5:30 AM.  Normally sleeps on her back and she rolled over to her side and when she rolled back she all of a sudden felt like everything was spinning.  If she lays perfectly still it seems better though not gone.  She was able to walk this morning but had to have help and fell off balance.  There is no headache, vision changes or focal weakness or numbness.  She is had nausea without vomiting and 2 loose stools.  No blood in them.  No fevers.  Past Medical History:  Diagnosis Date   Arthritis    thumbs    Mitral valve prolapse    Pneumonia    hx of 30 years ago    Postmenopausal bleeding    Raynaud's disease    mostly in hands    Raynaud's disease    Scoliosis    Tinnitus    Uterine cancer [facility])    Vaginal delivery    x3    Patient Active Problem List   Diagnosis Date Noted   Endometrial cancer ([place]) 08/27/2017   Scoliosis 07/17/2017   Vitamin D deficiency 07/17/2017   Endometrial ca ([place]) 07/17/2017    Past Surgical History:  Procedure Laterality Date   bilateral cataract surgery      DILATION AND CURETTAGE OF UTERUS     recently and 3 yrs ago   LYMPH NODE BIOPSY Bilateral 08/27/2017   Procedure: SENTINEL LYMPH NODE BIOPSY;  Surgeon: Everitt Amber, MD;  Location: [facility];  Service: Gynecology;  Laterality: Bilateral;   ROBOTIC ASSISTED LAPAROSCOPIC SACROCOLPOPEXY  05/2020   Dr. Jacki Cones   ROBOTIC ASSISTED TOTAL HYSTERECTOMY WITH BILATERAL SALPINGO OOPHERECTOMY Bilateral 08/27/2017   Procedure: XI ROBOTIC ASSISTED TOTAL HYSTERECTOMY WITH BILATERAL SALPINGO OOPHORECTOMY;  Surgeon: Everitt Amber, MD;  Location: [facility];  Service: Gynecology;  Laterality:  Bilateral;   SPINAL FUSION  1966     OB History   No obstetric history on file.     Family History  Problem Relation Age of Onset   Osteoporosis Mother    BRCA 1/2 Mother    Hypertension Mother    CVA Mother    Breast cancer Mother 10   Hypertension Father    CVA Father    Cancer Sister        in leg   Scleroderma Sister    Osteoporosis Sister    Osteoporosis Sister    Hypertension Sister     Social History   Tobacco Use   Smoking status: Never   Smokeless tobacco: Never  Vaping Use   Vaping Use: Never used  Substance Use Topics   Alcohol use: Yes    Comment: 2-3 glasses of wine on the weekend, no more than 1 a day   Drug use: No    Home Medications Prior to Admission medications   Medication Sig Start Date End Date Taking? Authorizing Provider  acetaminophen (TYLENOL) 500 MG tablet Take 500 mg by mouth every 6 (six) hours as needed for moderate pain or headache.    [provider]  Calcium Carbonate-Vitamin D (CALCIUM-VITAMIN D3 PO) Take 1 tablet by mouth daily.  [provider]  Cholecalciferol 100 MCG (4000 UT) CAPS Take 4,000 Units by mouth daily.    [provider]  cyclobenzaprine (FLEXERIL) 5 MG tablet Take 5 mg by mouth 3 (three) times daily as needed for muscle spasms.     [provider]  estradiol (ESTRACE) 0.1 MG/GM vaginal cream Use berry sized amount nightly PV x 2 weeks then QOD. 09/22/19   [provider]  estradiol (VIVELLE-DOT) 0.1 MG/24HR patch Place 0.5 patches onto the skin 2 (two) times a week. 08/01/20   [provider]  ibandronate (BONIVA) 150 MG tablet Take 150 mg by mouth every 30 (thirty) days. Take in the morning with a full glass of water, on an empty stomach, and do not take anything else by mouth or lie down for the next 30 min.    [provider]  Loma [place] Calcium 500 MG TABS Take 500 mg by mouth daily.    [provider]    Allergies    Lanolin  Review  of Systems   Review of Systems  Constitutional:  Negative for fever.  HENT:  Positive for tinnitus (chronic).   Eyes:  Negative for visual disturbance.  Cardiovascular:  Negative for chest pain.  Gastrointestinal:  Positive for diarrhea and nausea. Negative for abdominal pain and vomiting.  Neurological:  Positive for dizziness. Negative for weakness, numbness and headaches.  All other systems reviewed and are negative.  Physical Exam Updated Vital Signs BP 125/75   Pulse 79   Temp (!) 95.5 F (35.3 C) (Rectal)   Resp 14   Ht '5\' 4"'  (1.626 m)   Wt 59 kg   SpO2 98%   BMI 22.31 kg/m   Physical Exam Vitals and nursing note reviewed.  Constitutional:      General: She is not in acute distress.    Appearance: She is well-developed. She is not ill-appearing or diaphoretic.  HENT:     Head: Normocephalic and atraumatic.     Right Ear: External ear normal.     Left Ear: External ear normal.     Nose: Nose normal.  Eyes:     General:        Right eye: No discharge.        Left eye: No discharge.     Extraocular Movements:     Right eye: Nystagmus present.     Left eye: Nystagmus present.     Pupils: Pupils are equal, round, and reactive to light.     Comments: Nystagmus when looking to left  Cardiovascular:     Rate and Rhythm: Normal rate and regular rhythm.     Heart sounds: Normal heart sounds.  Pulmonary:     Effort: Pulmonary effort is normal.     Breath sounds: Normal breath sounds.  Abdominal:     Palpations: Abdomen is soft.     Tenderness: There is no abdominal tenderness.  Skin:    General: Skin is warm and dry.  Neurological:     Mental Status: She is alert.     Comments: CN 3-12 grossly intact. 5/5 strength in all 4 extremities. Grossly normal sensation. Normal finger to nose.   Psychiatric:        Mood and Affect: Mood is not anxious.    ED Results / Procedures / Treatments   Labs (all labs ordered are listed, but only abnormal results are  displayed) Labs Reviewed  BASIC METABOLIC PANEL - Abnormal; Notable for the following components:  Result Value   Glucose, Bld 145 (*)    All other components within normal limits  CBC WITH DIFFERENTIAL/PLATELET    EKG EKG Interpretation  Date/Time:  Sunday January 15 2021 07:10:44 EDT Ventricular Rate:  59 PR Interval:  213 QRS Duration: 98 QT Interval:  460 QTC Calculation: 456 R Axis:   15 Text Interpretation: Sinus rhythm Borderline prolonged PR interval Probable left atrial enlargement Anteroseptal infarct, age indeterminate no acute ST/T changes No old tracing to compare Confirmed by Sherwood Gambler [phone]) on 01/15/2021 7:14:31 AM  Radiology CT Head Wo Contrast  Result Date: 01/15/2021 CLINICAL DATA:  Woke up this morning with dizziness and sweating. Possible stroke. EXAM: CT HEAD WITHOUT CONTRAST TECHNIQUE: Contiguous axial images were obtained from the base of the skull through the vertex without intravenous contrast. COMPARISON:  None. FINDINGS: Brain: Ventricles, cisterns and other CSF spaces are normal. No mass, mass effect, shift of midline structures or acute hemorrhage. Small old lacunar infarct versus prominent perivascular space along the inferior aspect of the left lentiform nucleus. Subtle low-attenuation with ill definition of the gray-white differentiation over the left temporal region which could be artifactual, although may be due to early infarction. Vascular: No hyperdense vessel or unexpected calcification. Skull: Normal. Negative for fracture or focal lesion. Sinuses/Orbits: No acute finding. Other: None. IMPRESSION: 1. Subtle low-attenuation with ill definition of the gray-white differentiation over the left temporal region which could be artifactual, although may be due to early infarction. 2. Small old lacunar infarct versus prominent perivascular space along the inferior aspect of the left lentiform nucleus. Electronically Signed   By: Marin Olp M.D.   On:  01/15/2021 09:22    Procedures Procedures   Medications Ordered in ED Medications  sodium chloride 0.9 % bolus 1,000 mL (0 mLs Intravenous Stopped 01/15/21 1025)  meclizine (ANTIVERT) tablet 25 mg (25 mg Oral Given 01/15/21 0539)    ED Course  I have reviewed the triage vital signs and the nursing notes.  Pertinent labs & imaging results that were available during my care of the patient were reviewed by me and considered in my medical decision making (see chart for details).    MDM Rules/Calculators/A&P                          ECG, CT, and labs have been reviewed.  Questionable infarct versus artifact in the temporal lobe, though this would not necessarily correlate with her symptoms.  She is a little bit dizzy though improved after Antivert.  Given all this, I think it would make sense to get an MRI to rule out an acute infarct.  Discussed with patient and husband and they do not want to be transported there, but would rather drive POV.  I discussed with Dr. Jeanell Sparrow in the [facility] emergency department who is aware of transfer.  Otherwise, appears stable for transfer. Final Clinical Impression(s) / ED Diagnoses Final diagnoses:  None    Rx / DC Orders ED Discharge Orders     None        Sherwood Gambler, MD 01/15/21 1058

## 2021-01-15 NOTE — ED Notes (Signed)
ED Provider at bedside. 

## 2021-01-20 ENCOUNTER — Ambulatory Visit
Admission: RE | Admit: 2021-01-20 | Discharge: 2021-01-20 | Disposition: A | Discharge status: Home / Self Care | Payer: PPO | Source: Ambulatory Visit | Attending: Obstetrics & Gynecology | Admitting: Obstetrics & Gynecology

## 2021-01-20 ENCOUNTER — Other Ambulatory Visit: Payer: Self-pay

## 2021-01-20 DIAGNOSIS — Z1231 Encounter for screening mammogram for malignant neoplasm of breast: Secondary | ICD-10-CM

## 2021-01-24 ENCOUNTER — Other Ambulatory Visit: Payer: Self-pay | Admitting: Obstetrics & Gynecology

## 2021-01-24 DIAGNOSIS — R928 Other abnormal and inconclusive findings on diagnostic imaging of breast: Secondary | ICD-10-CM

## 2021-01-28 ENCOUNTER — Ambulatory Visit
Admission: RE | Admit: 2021-01-28 | Discharge: 2021-01-28 | Disposition: A | Discharge status: Home / Self Care | Payer: PPO | Source: Ambulatory Visit | Attending: Obstetrics & Gynecology | Admitting: Obstetrics & Gynecology

## 2021-01-28 ENCOUNTER — Other Ambulatory Visit: Payer: Self-pay

## 2021-01-28 DIAGNOSIS — R928 Other abnormal and inconclusive findings on diagnostic imaging of breast: Secondary | ICD-10-CM

## 2021-01-28 DIAGNOSIS — R922 Inconclusive mammogram: Secondary | ICD-10-CM | POA: Diagnosis not present

## 2021-02-01 ENCOUNTER — Other Ambulatory Visit: Payer: PPO

## 2021-02-10 ENCOUNTER — Other Ambulatory Visit (HOSPITAL_COMMUNITY): Payer: Self-pay | Admitting: Internal Medicine

## 2021-02-10 DIAGNOSIS — R9431 Abnormal electrocardiogram [ECG] [EKG]: Secondary | ICD-10-CM

## 2021-02-20 ENCOUNTER — Other Ambulatory Visit: Payer: Self-pay

## 2021-02-20 ENCOUNTER — Ambulatory Visit (HOSPITAL_COMMUNITY)
Admission: RE | Admit: 2021-02-20 | Discharge: 2021-02-20 | Disposition: A | Discharge status: Home / Self Care | Payer: PPO | Source: Ambulatory Visit | Attending: Internal Medicine | Admitting: Internal Medicine

## 2021-02-20 DIAGNOSIS — I341 Nonrheumatic mitral (valve) prolapse: Secondary | ICD-10-CM | POA: Diagnosis not present

## 2021-02-20 DIAGNOSIS — R9431 Abnormal electrocardiogram [ECG] [EKG]: Secondary | ICD-10-CM | POA: Diagnosis not present

## 2021-02-20 DIAGNOSIS — I313 Pericardial effusion (noninflammatory): Secondary | ICD-10-CM | POA: Insufficient documentation

## 2021-02-20 LAB — ECHOCARDIOGRAM COMPLETE
Area-P 1/2: 2.83 cm2
S' Lateral: 2 cm

## 2021-02-21 DIAGNOSIS — M545 Low back pain, unspecified: Secondary | ICD-10-CM | POA: Diagnosis not present

## 2021-02-27 DIAGNOSIS — Z6822 Body mass index (BMI) 22.0-22.9, adult: Secondary | ICD-10-CM | POA: Diagnosis not present

## 2021-02-27 DIAGNOSIS — Z01419 Encounter for gynecological examination (general) (routine) without abnormal findings: Secondary | ICD-10-CM | POA: Diagnosis not present

## 2021-05-09 DIAGNOSIS — M545 Low back pain, unspecified: Secondary | ICD-10-CM | POA: Diagnosis not present

## 2021-05-09 DIAGNOSIS — M4125 Other idiopathic scoliosis, thoracolumbar region: Secondary | ICD-10-CM | POA: Diagnosis not present

## 2021-05-09 DIAGNOSIS — M542 Cervicalgia: Secondary | ICD-10-CM | POA: Diagnosis not present

## 2021-07-25 ENCOUNTER — Telehealth: Payer: Self-pay | Admitting: *Deleted

## 2021-07-25 NOTE — Telephone Encounter (Signed)
Patient called and scheduled a follow up appt for March 16th with Dr Delsa Sale

## 2021-10-09 DIAGNOSIS — R7989 Other specified abnormal findings of blood chemistry: Secondary | ICD-10-CM | POA: Diagnosis not present

## 2021-10-09 DIAGNOSIS — E785 Hyperlipidemia, unspecified: Secondary | ICD-10-CM | POA: Diagnosis not present

## 2021-10-09 DIAGNOSIS — M859 Disorder of bone density and structure, unspecified: Secondary | ICD-10-CM | POA: Diagnosis not present

## 2021-10-16 DIAGNOSIS — Z23 Encounter for immunization: Secondary | ICD-10-CM | POA: Diagnosis not present

## 2021-10-16 DIAGNOSIS — E785 Hyperlipidemia, unspecified: Secondary | ICD-10-CM | POA: Diagnosis not present

## 2021-10-16 DIAGNOSIS — M8580 Other specified disorders of bone density and structure, unspecified site: Secondary | ICD-10-CM | POA: Diagnosis not present

## 2021-10-16 DIAGNOSIS — I7300 Raynaud's syndrome without gangrene: Secondary | ICD-10-CM | POA: Diagnosis not present

## 2021-10-16 DIAGNOSIS — M419 Scoliosis, unspecified: Secondary | ICD-10-CM | POA: Diagnosis not present

## 2021-10-16 DIAGNOSIS — Z0000 Encounter for general adult medical examination without abnormal findings: Secondary | ICD-10-CM | POA: Diagnosis not present

## 2021-10-16 DIAGNOSIS — K8020 Calculus of gallbladder without cholecystitis without obstruction: Secondary | ICD-10-CM | POA: Diagnosis not present

## 2021-10-16 DIAGNOSIS — Z1331 Encounter for screening for depression: Secondary | ICD-10-CM | POA: Diagnosis not present

## 2021-10-16 DIAGNOSIS — R82998 Other abnormal findings in urine: Secondary | ICD-10-CM | POA: Diagnosis not present

## 2021-10-16 DIAGNOSIS — R3121 Asymptomatic microscopic hematuria: Secondary | ICD-10-CM | POA: Diagnosis not present

## 2021-10-16 DIAGNOSIS — H8110 Benign paroxysmal vertigo, unspecified ear: Secondary | ICD-10-CM | POA: Diagnosis not present

## 2021-10-16 DIAGNOSIS — Z1212 Encounter for screening for malignant neoplasm of rectum: Secondary | ICD-10-CM | POA: Diagnosis not present

## 2021-10-16 DIAGNOSIS — Z1339 Encounter for screening examination for other mental health and behavioral disorders: Secondary | ICD-10-CM | POA: Diagnosis not present

## 2021-10-17 ENCOUNTER — Encounter: Payer: Self-pay | Admitting: Obstetrics & Gynecology

## 2021-10-19 ENCOUNTER — Inpatient Hospital Stay: Payer: PPO | Attending: Obstetrics & Gynecology | Admitting: Obstetrics & Gynecology

## 2021-10-19 ENCOUNTER — Encounter: Payer: Self-pay | Admitting: Obstetrics & Gynecology

## 2021-10-19 ENCOUNTER — Other Ambulatory Visit: Payer: Self-pay

## 2021-10-19 VITALS — BP 115/63 | HR 77 | Temp 97.5°F | Resp 18 | Ht 64.0 in | Wt 132.0 lb

## 2021-10-19 DIAGNOSIS — N898 Other specified noninflammatory disorders of vagina: Secondary | ICD-10-CM | POA: Diagnosis not present

## 2021-10-19 DIAGNOSIS — C541 Malignant neoplasm of endometrium: Secondary | ICD-10-CM

## 2021-10-19 DIAGNOSIS — Z8542 Personal history of malignant neoplasm of other parts of uterus: Secondary | ICD-10-CM | POA: Insufficient documentation

## 2021-10-19 DIAGNOSIS — Z90710 Acquired absence of both cervix and uterus: Secondary | ICD-10-CM | POA: Insufficient documentation

## 2021-10-19 DIAGNOSIS — N72 Inflammatory disease of cervix uteri: Secondary | ICD-10-CM | POA: Diagnosis not present

## 2021-10-19 NOTE — Assessment & Plan Note (Addendum)
71 year old with history of a stage IA grade 1 endometrioid adenocarcinoma (MSI stable, MMR normal) s/p robotic assisted staging with hysterectomy on 08/27/17. ?Sxs, exam c/w sequelae from the recent POP surgery--I.e, mesh complication, granulation tissue ? ?>review the histology from the bx ?>f/u w/URO-GYN is planned ??>She will see a generalist gynecologist in 6 months and return in 12 months.  ?

## 2021-10-19 NOTE — Progress Notes (Signed)
Follow Up Note: Gyn-Onc ? ?Stephanie Mahoney 71 y.o. female ? ?CC: She presents for a f/u visit ? ? ?HPI: The oncology history was reviewed. ? ?Interval History: She denies any abdominal/pelvic pain, cough, lethargy or abdominal distention.  She reports new onset f/u postcoital pink discharge and urinary incontinence.  Interim surgery for POP.  Uses transdermal/vaginal E2, lubricants.  ? ?Review of Systems  ?Review of Systems  ?Constitutional:  Negative for malaise/fatigue and weight loss.  ?Respiratory:  Negative for shortness of breath and wheezing.   ?Cardiovascular:  Negative for chest pain and leg swelling.  ?Gastrointestinal:  Negative for abdominal pain, blood in stool, constipation, nausea and vomiting.  ?Genitourinary:  Negative for dysuria, frequency, hematuria and urgency.  ?Musculoskeletal:  Negative for joint pain and myalgias.  ?Neurological:  Negative for weakness.  ?Psychiatric/Behavioral:  Negative for depression. The patient does not have insomnia.   ? ?Current medications, allergy, social history, past surgical history, past medical history, family history were all reviewed. ? ? ? ?Vitals:  BP 115/63 (BP Location: Left Arm, Patient Position: Sitting)   Pulse 77   Temp (!) 97.5 ?F (36.4 ?C) (Oral)   Resp 18   Ht '5\' 4"'  (1.626 m)   Wt 132 lb (59.9 kg)   SpO2 100% Comment: RA  BMI 22.66 kg/m?   ? ?Physical Exam ?Exam conducted with a chaperone present.  ?Constitutional:   ?   General: She is not in acute distress. ?Cardiovascular:  ?   Rate and Rhythm: Normal rate and regular rhythm.  ?Pulmonary:  ?   Effort: Pulmonary effort is normal.  ?   Breath sounds: Normal breath sounds. No wheezing or rhonchi.  ?Abdominal:  ?   Palpations: Abdomen is soft.  ?   Tenderness: There is no abdominal tenderness. There is no right CVA tenderness or left CVA tenderness.  ?   Hernia: No hernia is present.  ?Genitourinary: ?   General: Normal vulva.  ?   Urethra: No urethral lesion.  ?   Vagina: Midline erythematous  ?erosion at apex; ?left-sided.   permanent suture; NT; the apex was prepped w/betadine and infiltrated w/1% lidocaine; a bx was taken with biopsy forceps; Monsel's was applied ?Musculoskeletal:  ?   Cervical back: Neck supple.  ?   Right lower leg: No edema.  ?   Left lower leg: No edema.  ?Lymphadenopathy:  ?   Upper Body:  ?   Right upper body: No supraclavicular adenopathy.  ?   Left upper body: No supraclavicular adenopathy.  ?   Lower Body: No right inguinal adenopathy. No left inguinal adenopathy.  ?Skin: ?   Findings: No rash.  ?Neurological:  ?   Mental Status: She is oriented to person, place, and time.  ? ?Assessment/Plan:  ?Endometrial cancer ([place]) ?71 year old with history of a stage IA grade 1 endometrioid adenocarcinoma (MSI stable, MMR normal) s/p robotic assisted staging with hysterectomy on 08/27/17. ?Sxs, exam c/w sequelae from the recent POP surgery--I.e, mesh complication, granulation tissue ? ?>review the histology from the bx ?>f/u w/URO-GYN is planned ? >She will see a generalist gynecologist in 6 months and return in 12 months.   ? ? ?Lahoma Crocker, MD  ?

## 2021-10-19 NOTE — Patient Instructions (Signed)
Return in 1 yr 

## 2021-10-23 ENCOUNTER — Telehealth: Payer: Self-pay | Admitting: *Deleted

## 2021-10-23 LAB — SURGICAL PATHOLOGY

## 2021-10-23 NOTE — Telephone Encounter (Signed)
Spoke with pt this afternoon regarding her biopsy results. Pt stated that she already saw it on her mychart. Reiterated that per Joylene John, NP there's no cancer on the biopsy. It's showing inflammation and healing tissue around the mesh. Pt stated that she'll print out the report and bring it to the follow up appointment with her surgeon who performed the surgery next week. Pt voiced understanding and did not voice any other concerns.   ?

## 2021-10-26 DIAGNOSIS — T83721A Exposure of implanted vaginal mesh and other prosthetic materials into vagina, initial encounter: Secondary | ICD-10-CM | POA: Diagnosis not present

## 2021-11-27 DIAGNOSIS — M8589 Other specified disorders of bone density and structure, multiple sites: Secondary | ICD-10-CM | POA: Diagnosis not present

## 2021-12-07 ENCOUNTER — Other Ambulatory Visit: Payer: Self-pay

## 2021-12-07 DIAGNOSIS — M810 Age-related osteoporosis without current pathological fracture: Secondary | ICD-10-CM

## 2021-12-12 ENCOUNTER — Telehealth: Payer: Self-pay | Admitting: Pharmacy Technician

## 2021-12-12 NOTE — Telephone Encounter (Addendum)
Prolia BIV complete - no auth needed

## 2021-12-22 DIAGNOSIS — L03114 Cellulitis of left upper limb: Secondary | ICD-10-CM | POA: Diagnosis not present

## 2021-12-22 DIAGNOSIS — D72829 Elevated white blood cell count, unspecified: Secondary | ICD-10-CM | POA: Diagnosis not present

## 2021-12-22 DIAGNOSIS — N390 Urinary tract infection, site not specified: Secondary | ICD-10-CM | POA: Diagnosis not present

## 2021-12-22 DIAGNOSIS — R10814 Left lower quadrant abdominal tenderness: Secondary | ICD-10-CM | POA: Diagnosis not present

## 2021-12-22 DIAGNOSIS — R197 Diarrhea, unspecified: Secondary | ICD-10-CM | POA: Diagnosis not present

## 2021-12-22 DIAGNOSIS — K5900 Constipation, unspecified: Secondary | ICD-10-CM | POA: Diagnosis not present

## 2021-12-22 DIAGNOSIS — R1032 Left lower quadrant pain: Secondary | ICD-10-CM | POA: Diagnosis not present

## 2021-12-22 DIAGNOSIS — C55 Malignant neoplasm of uterus, part unspecified: Secondary | ICD-10-CM | POA: Diagnosis not present

## 2021-12-29 ENCOUNTER — Other Ambulatory Visit (HOSPITAL_BASED_OUTPATIENT_CLINIC_OR_DEPARTMENT_OTHER): Payer: Self-pay | Admitting: Internal Medicine

## 2021-12-29 ENCOUNTER — Other Ambulatory Visit: Payer: Self-pay | Admitting: Internal Medicine

## 2021-12-29 DIAGNOSIS — R109 Unspecified abdominal pain: Secondary | ICD-10-CM

## 2022-01-02 ENCOUNTER — Encounter (HOSPITAL_BASED_OUTPATIENT_CLINIC_OR_DEPARTMENT_OTHER): Payer: Self-pay

## 2022-01-02 ENCOUNTER — Ambulatory Visit (HOSPITAL_BASED_OUTPATIENT_CLINIC_OR_DEPARTMENT_OTHER)
Admission: RE | Admit: 2022-01-02 | Discharge: 2022-01-02 | Disposition: A | Discharge status: Home / Self Care | Payer: PPO | Source: Ambulatory Visit | Attending: Internal Medicine | Admitting: Internal Medicine

## 2022-01-02 DIAGNOSIS — R109 Unspecified abdominal pain: Secondary | ICD-10-CM | POA: Diagnosis not present

## 2022-01-02 DIAGNOSIS — N281 Cyst of kidney, acquired: Secondary | ICD-10-CM | POA: Diagnosis not present

## 2022-01-02 DIAGNOSIS — R1030 Lower abdominal pain, unspecified: Secondary | ICD-10-CM | POA: Diagnosis not present

## 2022-01-02 LAB — POCT I-STAT CREATININE: Creatinine, Ser: 0.7 mg/dL (ref 0.44–1.00)

## 2022-01-02 MED ORDER — IOHEXOL 300 MG/ML  SOLN
100.0000 mL | Freq: Once | INTRAMUSCULAR | Status: AC | PRN
  Administered 2022-01-02: 80 mL via INTRAVENOUS

## 2022-01-11 ENCOUNTER — Other Ambulatory Visit: Payer: Self-pay | Admitting: Obstetrics & Gynecology

## 2022-01-11 DIAGNOSIS — Z1231 Encounter for screening mammogram for malignant neoplasm of breast: Secondary | ICD-10-CM

## 2022-01-23 DIAGNOSIS — K529 Noninfective gastroenteritis and colitis, unspecified: Secondary | ICD-10-CM | POA: Diagnosis not present

## 2022-01-23 DIAGNOSIS — R1030 Lower abdominal pain, unspecified: Secondary | ICD-10-CM | POA: Diagnosis not present

## 2022-01-30 ENCOUNTER — Ambulatory Visit
Admission: RE | Admit: 2022-01-30 | Discharge: 2022-01-30 | Disposition: A | Discharge status: Home / Self Care | Payer: PPO | Source: Ambulatory Visit | Attending: Obstetrics & Gynecology | Admitting: Obstetrics & Gynecology

## 2022-01-30 DIAGNOSIS — Z1231 Encounter for screening mammogram for malignant neoplasm of breast: Secondary | ICD-10-CM | POA: Diagnosis not present

## 2022-02-07 ENCOUNTER — Telehealth: Payer: Self-pay | Admitting: Pharmacy Technician

## 2022-02-07 ENCOUNTER — Encounter: Payer: Self-pay | Admitting: Pulmonary Disease

## 2022-02-07 NOTE — Telephone Encounter (Addendum)
Auth Submission: NO AUTH NEEDED Payer: [employer] ADV Medication & CPT/J Code(s) submitted: Prolia (Denosumab) [id number] Route of submission (phone, fax, portal): PHONE: [phone] Auth type: Buy/Bill Units/visits requested: X1 DOSE Reference number: [id number] 02/07/22 '@9'$ :30 Ref# [id number] Patient is responsible for 20% co-insurance Approval from: 02/07/22 to 08/05/22

## 2022-02-09 ENCOUNTER — Ambulatory Visit (INDEPENDENT_AMBULATORY_CARE_PROVIDER_SITE_OTHER): Payer: PPO

## 2022-02-09 VITALS — BP 112/71 | HR 98 | Temp 98.3°F | Resp 16 | Ht 64.0 in | Wt 132.6 lb

## 2022-02-09 DIAGNOSIS — M810 Age-related osteoporosis without current pathological fracture: Secondary | ICD-10-CM

## 2022-02-09 MED ORDER — DENOSUMAB 60 MG/ML ~~LOC~~ SOSY
60.0000 mg | PREFILLED_SYRINGE | Freq: Once | SUBCUTANEOUS | Status: AC
  Administered 2022-02-09: 60 mg via SUBCUTANEOUS
  Filled 2022-02-09: qty 1

## 2022-02-09 NOTE — Progress Notes (Signed)
Diagnosis: Osteoporosis  Provider:  Marshell Garfinkel, MD  Procedure: Injection  Prolia (Denosumab), Dose: 60 mg, Site: subcutaneous, Number of injections: 1  Discharge: observation period completed. Condition: Good, Destination: Home . AVS provided to patient.   Performed by:  Adelina Mings, LPN

## 2022-02-21 ENCOUNTER — Encounter: Payer: Self-pay | Admitting: Surgery

## 2022-03-13 DIAGNOSIS — R109 Unspecified abdominal pain: Secondary | ICD-10-CM | POA: Diagnosis not present

## 2022-03-29 DIAGNOSIS — M542 Cervicalgia: Secondary | ICD-10-CM | POA: Diagnosis not present

## 2022-04-03 DIAGNOSIS — M542 Cervicalgia: Secondary | ICD-10-CM | POA: Diagnosis not present

## 2022-04-13 DIAGNOSIS — Z6823 Body mass index (BMI) 23.0-23.9, adult: Secondary | ICD-10-CM | POA: Diagnosis not present

## 2022-04-13 DIAGNOSIS — Z124 Encounter for screening for malignant neoplasm of cervix: Secondary | ICD-10-CM | POA: Diagnosis not present

## 2022-04-13 DIAGNOSIS — Z1272 Encounter for screening for malignant neoplasm of vagina: Secondary | ICD-10-CM | POA: Diagnosis not present

## 2022-04-25 DIAGNOSIS — M542 Cervicalgia: Secondary | ICD-10-CM | POA: Diagnosis not present

## 2022-05-31 ENCOUNTER — Other Ambulatory Visit (HOSPITAL_BASED_OUTPATIENT_CLINIC_OR_DEPARTMENT_OTHER): Payer: Self-pay | Admitting: Gastroenterology

## 2022-05-31 ENCOUNTER — Other Ambulatory Visit: Payer: Self-pay | Admitting: Gastroenterology

## 2022-05-31 DIAGNOSIS — R109 Unspecified abdominal pain: Secondary | ICD-10-CM

## 2022-06-11 ENCOUNTER — Ambulatory Visit (HOSPITAL_BASED_OUTPATIENT_CLINIC_OR_DEPARTMENT_OTHER)
Admission: RE | Admit: 2022-06-11 | Discharge: 2022-06-11 | Disposition: A | Discharge status: Home / Self Care | Payer: PPO | Source: Ambulatory Visit | Attending: Gastroenterology | Admitting: Gastroenterology

## 2022-06-11 DIAGNOSIS — R109 Unspecified abdominal pain: Secondary | ICD-10-CM | POA: Diagnosis not present

## 2022-06-11 MED ORDER — IOHEXOL 12 MG/ML PO SOLN
1000.0000 mL | Freq: Once | ORAL | Status: AC
Start: 2022-06-11 — End: 2022-06-11
  Administered 2022-06-11: 1000 mL via ORAL

## 2022-06-11 MED ORDER — IOHEXOL 300 MG/ML  SOLN
100.0000 mL | Freq: Once | INTRAMUSCULAR | Status: AC | PRN
  Administered 2022-06-11: 100 mL via INTRAVENOUS

## 2022-06-14 LAB — POCT I-STAT CREATININE: Creatinine, Ser: 0.7 mg/dL (ref 0.44–1.00)

## 2022-08-27 ENCOUNTER — Other Ambulatory Visit: Payer: Self-pay

## 2022-08-30 ENCOUNTER — Telehealth: Payer: Self-pay | Admitting: Pharmacy Technician

## 2022-08-30 NOTE — Telephone Encounter (Signed)
Auth Submission: NO AUTH NEEDED Payer: [employer] ADVT Medication & CPT/J Code(s) submitted: Prolia (Denosumab) [id number] Route of submission (phone, fax, portal):  Phone # [phone] Fax # [phone] Auth type: Buy/Bill Units/visits requested: X2 Reference number: REF: [id number] Tammy-R Approval from: 08/30/22 to 08/06/23

## 2022-09-14 ENCOUNTER — Ambulatory Visit (INDEPENDENT_AMBULATORY_CARE_PROVIDER_SITE_OTHER): Payer: PPO | Admitting: *Deleted

## 2022-09-14 VITALS — BP 122/83 | HR 73 | Temp 97.6°F | Resp 16 | Ht 64.0 in | Wt 135.4 lb

## 2022-09-14 DIAGNOSIS — M810 Age-related osteoporosis without current pathological fracture: Secondary | ICD-10-CM

## 2022-09-14 MED ORDER — DENOSUMAB 60 MG/ML ~~LOC~~ SOSY
60.0000 mg | PREFILLED_SYRINGE | Freq: Once | SUBCUTANEOUS | Status: AC
  Administered 2022-09-14: 60 mg via SUBCUTANEOUS
  Filled 2022-09-14: qty 1

## 2022-09-14 NOTE — Progress Notes (Signed)
Diagnosis: Osteoporosis  Provider:  Marshell Garfinkel MD  Procedure: Injection  Prolia (Denosumab), Dose: 60 mg, Site: subcutaneous, Number of injections: 1  Post Care: Patient declined observation  Discharge: Condition: Good, Destination: Home . AVS Declined  Performed by:  Baxter Hire, RN

## 2022-09-18 DIAGNOSIS — R051 Acute cough: Secondary | ICD-10-CM | POA: Diagnosis not present

## 2022-09-18 DIAGNOSIS — R509 Fever, unspecified: Secondary | ICD-10-CM | POA: Diagnosis not present

## 2022-09-18 DIAGNOSIS — J209 Acute bronchitis, unspecified: Secondary | ICD-10-CM | POA: Diagnosis not present

## 2022-09-18 DIAGNOSIS — Z1152 Encounter for screening for COVID-19: Secondary | ICD-10-CM | POA: Diagnosis not present

## 2022-11-01 DIAGNOSIS — T83721S Exposure of implanted vaginal mesh and other prosthetic materials into vagina, sequela: Secondary | ICD-10-CM | POA: Diagnosis not present

## 2022-11-01 DIAGNOSIS — R8290 Unspecified abnormal findings in urine: Secondary | ICD-10-CM | POA: Diagnosis not present

## 2022-11-01 DIAGNOSIS — T83721A Exposure of implanted vaginal mesh and other prosthetic materials into vagina, initial encounter: Secondary | ICD-10-CM | POA: Diagnosis not present

## 2022-11-05 DIAGNOSIS — Z7689 Persons encountering health services in other specified circumstances: Secondary | ICD-10-CM | POA: Diagnosis not present

## 2022-11-05 DIAGNOSIS — E785 Hyperlipidemia, unspecified: Secondary | ICD-10-CM | POA: Diagnosis not present

## 2022-11-12 DIAGNOSIS — E785 Hyperlipidemia, unspecified: Secondary | ICD-10-CM | POA: Diagnosis not present

## 2022-11-12 DIAGNOSIS — Z0000 Encounter for general adult medical examination without abnormal findings: Secondary | ICD-10-CM | POA: Diagnosis not present

## 2022-11-12 DIAGNOSIS — M719 Bursopathy, unspecified: Secondary | ICD-10-CM | POA: Diagnosis not present

## 2022-11-12 DIAGNOSIS — M542 Cervicalgia: Secondary | ICD-10-CM | POA: Diagnosis not present

## 2022-11-12 DIAGNOSIS — M62830 Muscle spasm of back: Secondary | ICD-10-CM | POA: Diagnosis not present

## 2022-11-12 DIAGNOSIS — Z1331 Encounter for screening for depression: Secondary | ICD-10-CM | POA: Diagnosis not present

## 2022-11-12 DIAGNOSIS — G629 Polyneuropathy, unspecified: Secondary | ICD-10-CM | POA: Diagnosis not present

## 2022-11-12 DIAGNOSIS — H9190 Unspecified hearing loss, unspecified ear: Secondary | ICD-10-CM | POA: Diagnosis not present

## 2022-11-12 DIAGNOSIS — M8580 Other specified disorders of bone density and structure, unspecified site: Secondary | ICD-10-CM | POA: Diagnosis not present

## 2022-11-12 DIAGNOSIS — M545 Low back pain, unspecified: Secondary | ICD-10-CM | POA: Diagnosis not present

## 2022-11-12 DIAGNOSIS — I7300 Raynaud's syndrome without gangrene: Secondary | ICD-10-CM | POA: Diagnosis not present

## 2022-11-12 DIAGNOSIS — Z1339 Encounter for screening examination for other mental health and behavioral disorders: Secondary | ICD-10-CM | POA: Diagnosis not present

## 2022-11-17 ENCOUNTER — Ambulatory Visit
Admission: RE | Admit: 2022-11-17 | Discharge: 2022-11-17 | Disposition: A | Discharge status: Home / Self Care | Payer: PPO | Source: Ambulatory Visit | Attending: Urgent Care | Admitting: Urgent Care

## 2022-11-17 ENCOUNTER — Ambulatory Visit (INDEPENDENT_AMBULATORY_CARE_PROVIDER_SITE_OTHER): Payer: PPO

## 2022-11-17 VITALS — BP 119/84 | HR 89 | Temp 98.1°F | Resp 18

## 2022-11-17 DIAGNOSIS — R059 Cough, unspecified: Secondary | ICD-10-CM | POA: Diagnosis not present

## 2022-11-17 DIAGNOSIS — J209 Acute bronchitis, unspecified: Secondary | ICD-10-CM | POA: Diagnosis not present

## 2022-11-17 DIAGNOSIS — R062 Wheezing: Secondary | ICD-10-CM | POA: Diagnosis not present

## 2022-11-17 MED ORDER — CETIRIZINE HCL 1 MG/ML PO SOLN: 5.0000 mg | Freq: Every day | ORAL | 0 refills | Status: DC

## 2022-11-17 MED ORDER — PROMETHAZINE-DM 6.25-15 MG/5ML PO SYRP: 2.5000 mL | ORAL_SOLUTION | Freq: Three times a day (TID) | ORAL | 0 refills | Status: DC | PRN

## 2022-11-17 MED ORDER — ALBUTEROL SULFATE HFA 108 (90 BASE) MCG/ACT IN AERS: 1.0000 | INHALATION_SPRAY | Freq: Four times a day (QID) | RESPIRATORY_TRACT | 0 refills | Status: AC | PRN

## 2022-11-17 MED ORDER — DEXAMETHASONE SODIUM PHOSPHATE 10 MG/ML IJ SOLN
10.0000 mg | Freq: Once | INTRAMUSCULAR | Status: AC
  Administered 2022-11-17: 10 mg via INTRAMUSCULAR

## 2022-11-17 NOTE — ED Triage Notes (Signed)
Pt reports nasal congestion, cough  and wheezing x 2 days. States feels likes bronchitis

## 2022-11-17 NOTE — ED Provider Notes (Signed)
[facility]  Note:  This document was prepared using Conservation officer, historic buildings and may include unintentional dictation errors.  MRN: [id number] DOB: 1950-12-23  Subjective:   Stephanie Mahoney is a 72 y.o. female presenting for 2 day history of acute onset wheezing, sinus congestion, post-nasal drainage.  No overt for chest pain.  In general has body pains or particularly of the abdomen.  Feels like this is more chronic in nature however as she has a redundant colon and has had longstanding issues with this.  She did have an episode of bronchitis in January, reports that she underwent a course of doxycycline, steroids and an albuterol inhaler.  She did not like the steroids.  Would prefer to avoid that.  No smoking of any kind including cigarettes, cigars, vaping, marijuana use.    No current facility-administered medications for this encounter.  Current Outpatient Medications:    acetaminophen (TYLENOL) 500 MG tablet, Take 500 mg by mouth every 6 (six) hours as needed for moderate pain or headache., Disp: , Rfl:    Calcium Carbonate-Vitamin D (CALCIUM-VITAMIN D3 PO), Take 1 tablet by mouth daily., Disp: , Rfl:    celecoxib (CELEBREX) 100 MG capsule, Take 100 mg by mouth daily., Disp: , Rfl:    Cholecalciferol 100 MCG (4000 UT) CAPS, Take 4,000 Units by mouth daily., Disp: , Rfl:    cyclobenzaprine (FLEXERIL) 5 MG tablet, Take 5 mg by mouth 3 (three) times daily as needed for muscle spasms. , Disp: , Rfl:    estradiol (ESTRACE) 0.1 MG/GM vaginal cream, Use berry sized amount nightly PV x 2 weeks then QOD., Disp: , Rfl:    estradiol (VIVELLE-DOT) 0.1 MG/24HR patch, Place 0.5 patches onto the skin 2 (two) times a week., Disp: , Rfl:    meclizine (ANTIVERT) 25 MG tablet, Take 1 tablet (25 mg total) by mouth 3 (three) times daily as needed for dizziness., Disp: 30 tablet, Rfl: 0   ondansetron (ZOFRAN ODT) 4 MG disintegrating tablet, Take 1 tablet (4 mg total) by mouth  every 8 (eight) hours as needed for nausea or vomiting., Disp: 10 tablet, Rfl: 0   Allergies  Allergen Reactions   Lanolin Itching   Naproxen Other (See Comments)    Other reaction(s): constipation    Past Medical History:  Diagnosis Date   Arthritis    thumbs    BPPV (benign paroxysmal positional vertigo)    Mitral valve prolapse    Pneumonia    hx of 30 years ago    Postmenopausal bleeding    Raynaud's disease    mostly in hands    Raynaud's disease    Scoliosis    Tinnitus    Uterine cancer    Vaginal delivery    x3     Past Surgical History:  Procedure Laterality Date   bilateral cataract surgery      DILATION AND CURETTAGE OF UTERUS     recently and 3 yrs ago   LYMPH NODE BIOPSY Bilateral 08/27/2017   Procedure: SENTINEL LYMPH NODE BIOPSY;  Surgeon: Adolphus Birchwood, MD;  Location: [facility];  Service: Gynecology;  Laterality: Bilateral;   ROBOTIC ASSISTED LAPAROSCOPIC SACROCOLPOPEXY  05/2020   Dr. Blanchard Mane   ROBOTIC ASSISTED TOTAL HYSTERECTOMY WITH BILATERAL SALPINGO OOPHERECTOMY Bilateral 08/27/2017   Procedure: XI ROBOTIC ASSISTED TOTAL HYSTERECTOMY WITH BILATERAL SALPINGO OOPHORECTOMY;  Surgeon: Adolphus Birchwood, MD;  Location: [facility];  Service: Gynecology;  Laterality: Bilateral;   SPINAL FUSION  1966  Family History  Problem Relation Age of Onset   Osteoporosis Mother    BRCA 1/2 Mother    Hypertension Mother    CVA Mother    Breast cancer Mother 69   Hypertension Father    CVA Father    Cancer Sister        in leg   Scleroderma Sister    Osteoporosis Sister    Osteoporosis Sister    Hypertension Sister     Social History   Tobacco Use   Smoking status: Never   Smokeless tobacco: Never  Vaping Use   Vaping Use: Never used  Substance Use Topics   Alcohol use: Yes    Comment: 2-3 glasses of wine on the weekend, no more than 1 a day   Drug use: No    ROS   Objective:   Vitals: BP 119/84 (BP Location: Left Arm)   Pulse 89   Temp 98.1  F (36.7 C) (Oral)   Resp 18   SpO2 96%   Physical Exam Constitutional:      General: She is not in acute distress.    Appearance: Normal appearance. She is well-developed. She is not ill-appearing, toxic-appearing or diaphoretic.  HENT:     Head: Normocephalic and atraumatic.     Right Ear: External ear normal.     Left Ear: External ear normal.     Nose: Congestion present. No rhinorrhea.     Mouth/Throat:     Mouth: Mucous membranes are moist.     Pharynx: No oropharyngeal exudate or posterior oropharyngeal erythema.  Eyes:     General: No scleral icterus.       Right eye: No discharge.        Left eye: No discharge.     Extraocular Movements: Extraocular movements intact.  Cardiovascular:     Rate and Rhythm: Normal rate and regular rhythm.     Heart sounds: Normal heart sounds. No murmur heard.    No friction rub. No gallop.  Pulmonary:     Effort: Pulmonary effort is normal. No respiratory distress.     Breath sounds: No stridor. Examination of the right-middle field reveals rhonchi. Examination of the left-middle field reveals rhonchi. Rhonchi present. No wheezing or rales.  Chest:     Chest wall: No tenderness.  Skin:    General: Skin is warm and dry.  Neurological:     General: No focal deficit present.     Mental Status: She is alert and oriented to person, place, and time.  Psychiatric:        Mood and Affect: Mood normal.        Behavior: Behavior normal.     DG Chest 2 View  Result Date: 11/17/2022 CLINICAL DATA:  Cough and wheezing for 2 days. EXAM: CHEST - 2 VIEW COMPARISON:  None Available. FINDINGS: The heart size and mediastinal contours are within normal limits. Both lungs are clear. Severe thoracic dextroscoliosis is noted. IMPRESSION: No active cardiopulmonary disease. Severe thoracic dextroscoliosis. Electronically Signed   By: Danae Orleans M.D.   On: 11/17/2022 13:17    IM dexamethasone of 10 mg administered in clinic.  Assessment and Plan :    PDMP not reviewed this encounter.  1. Acute bronchitis, unspecified organism    Recommended IM dexamethasone, albuterol, supportive care for management of her bronchitis.  Chest x-ray was negative for pneumonia and therefore we will hold off on antibiotics that she just completed a course. Counseled patient on potential for  adverse effects with medications prescribed/recommended today, ER and return-to-clinic precautions discussed, patient verbalized understanding.    Wallis Bamberg, PA-C 11/17/22 1323

## 2022-11-19 DIAGNOSIS — R509 Fever, unspecified: Secondary | ICD-10-CM | POA: Diagnosis not present

## 2022-11-19 DIAGNOSIS — J069 Acute upper respiratory infection, unspecified: Secondary | ICD-10-CM | POA: Diagnosis not present

## 2022-11-19 DIAGNOSIS — J209 Acute bronchitis, unspecified: Secondary | ICD-10-CM | POA: Diagnosis not present

## 2022-11-19 DIAGNOSIS — Z1152 Encounter for screening for COVID-19: Secondary | ICD-10-CM | POA: Diagnosis not present

## 2022-11-19 DIAGNOSIS — R051 Acute cough: Secondary | ICD-10-CM | POA: Diagnosis not present

## 2022-11-19 DIAGNOSIS — R0981 Nasal congestion: Secondary | ICD-10-CM | POA: Diagnosis not present

## 2022-11-22 DIAGNOSIS — J209 Acute bronchitis, unspecified: Secondary | ICD-10-CM | POA: Diagnosis not present

## 2022-11-22 DIAGNOSIS — L270 Generalized skin eruption due to drugs and medicaments taken internally: Secondary | ICD-10-CM | POA: Diagnosis not present

## 2022-11-22 DIAGNOSIS — J069 Acute upper respiratory infection, unspecified: Secondary | ICD-10-CM | POA: Diagnosis not present

## 2022-11-28 DIAGNOSIS — R509 Fever, unspecified: Secondary | ICD-10-CM | POA: Diagnosis not present

## 2022-11-28 DIAGNOSIS — J209 Acute bronchitis, unspecified: Secondary | ICD-10-CM | POA: Diagnosis not present

## 2022-11-28 DIAGNOSIS — R6889 Other general symptoms and signs: Secondary | ICD-10-CM | POA: Diagnosis not present

## 2022-11-28 DIAGNOSIS — D72825 Bandemia: Secondary | ICD-10-CM | POA: Diagnosis not present

## 2022-11-28 DIAGNOSIS — J069 Acute upper respiratory infection, unspecified: Secondary | ICD-10-CM | POA: Diagnosis not present

## 2022-11-28 DIAGNOSIS — Z1152 Encounter for screening for COVID-19: Secondary | ICD-10-CM | POA: Diagnosis not present

## 2022-12-04 DIAGNOSIS — E785 Hyperlipidemia, unspecified: Secondary | ICD-10-CM | POA: Diagnosis not present

## 2022-12-11 DIAGNOSIS — D2372 Other benign neoplasm of skin of left lower limb, including hip: Secondary | ICD-10-CM | POA: Diagnosis not present

## 2022-12-11 DIAGNOSIS — D2371 Other benign neoplasm of skin of right lower limb, including hip: Secondary | ICD-10-CM | POA: Diagnosis not present

## 2022-12-11 DIAGNOSIS — Z85828 Personal history of other malignant neoplasm of skin: Secondary | ICD-10-CM | POA: Diagnosis not present

## 2022-12-11 DIAGNOSIS — D229 Melanocytic nevi, unspecified: Secondary | ICD-10-CM | POA: Diagnosis not present

## 2022-12-11 DIAGNOSIS — L821 Other seborrheic keratosis: Secondary | ICD-10-CM | POA: Diagnosis not present

## 2022-12-17 ENCOUNTER — Other Ambulatory Visit: Payer: Self-pay | Admitting: Internal Medicine

## 2022-12-17 DIAGNOSIS — Z1231 Encounter for screening mammogram for malignant neoplasm of breast: Secondary | ICD-10-CM

## 2023-01-09 DIAGNOSIS — J029 Acute pharyngitis, unspecified: Secondary | ICD-10-CM | POA: Diagnosis not present

## 2023-02-01 ENCOUNTER — Ambulatory Visit
Admission: RE | Admit: 2023-02-01 | Discharge: 2023-02-01 | Disposition: A | Discharge status: Home / Self Care | Payer: PPO | Source: Ambulatory Visit

## 2023-02-01 DIAGNOSIS — Z1231 Encounter for screening mammogram for malignant neoplasm of breast: Secondary | ICD-10-CM

## 2023-03-07 DIAGNOSIS — H04123 Dry eye syndrome of bilateral lacrimal glands: Secondary | ICD-10-CM | POA: Diagnosis not present

## 2023-03-07 DIAGNOSIS — D3131 Benign neoplasm of right choroid: Secondary | ICD-10-CM | POA: Diagnosis not present

## 2023-03-07 DIAGNOSIS — Z961 Presence of intraocular lens: Secondary | ICD-10-CM | POA: Diagnosis not present

## 2023-03-11 IMAGING — MG MM DIGITAL DIAGNOSTIC UNILAT*R* W/ TOMO W/ CAD
8 series · 8 of 24 positions shown · non-contrast
Comparison: Previous exams.

CLINICAL DATA: Screening recall for possible right breast
asymmetry.

EXAM:
DIGITAL DIAGNOSTIC UNILATERAL RIGHT MAMMOGRAM WITH TOMOSYNTHESIS AND
CAD; ULTRASOUND RIGHT BREAST LIMITED
TECHNIQUE: Right digital diagnostic mammography and breast tomosynthesis was
performed. The images were evaluated with computer-aided detection.;
Targeted ultrasound examination of the right breast was performed

[R MLO synth-2D (1 of 2)]
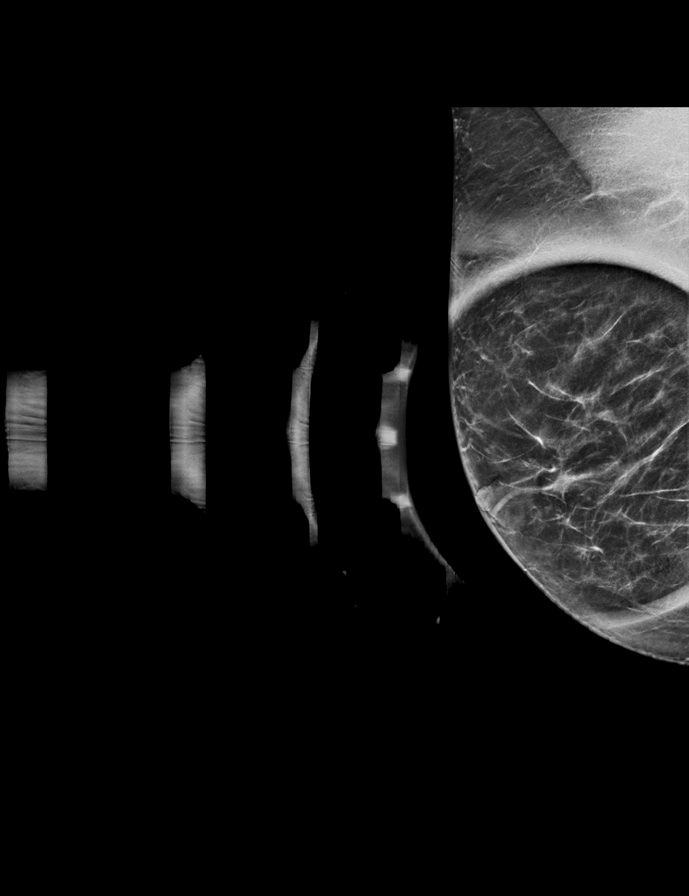

[R MLO synth-2D (2 of 2)]
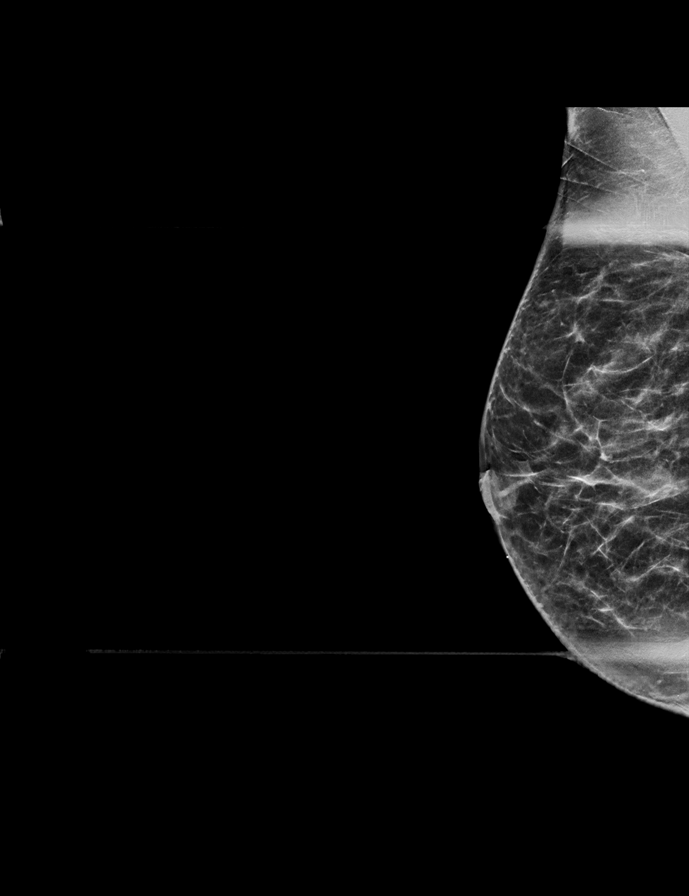

[R ML synth-2D (1 of 2)]
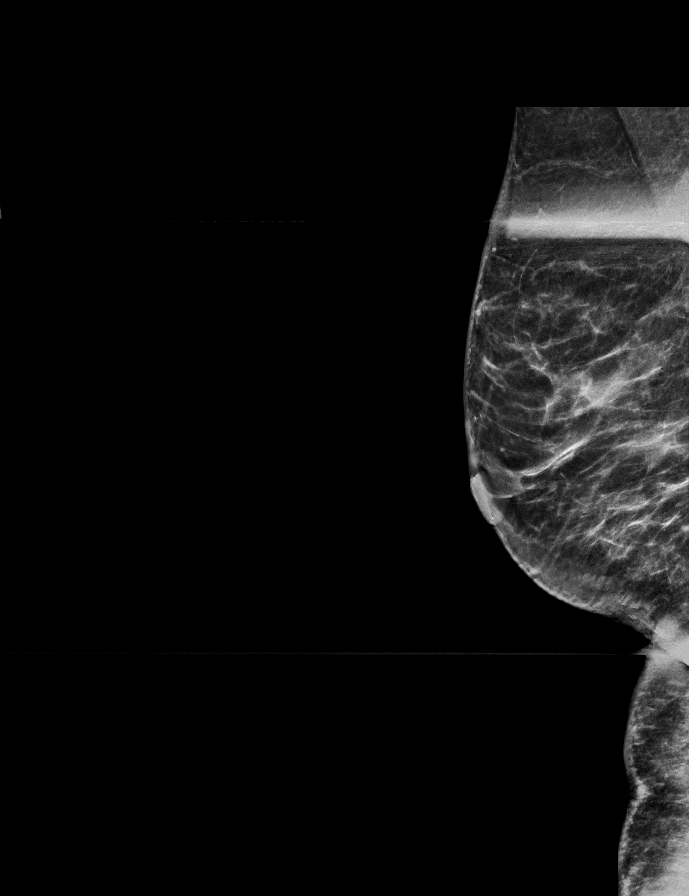

[R ML synth-2D (2 of 2)]
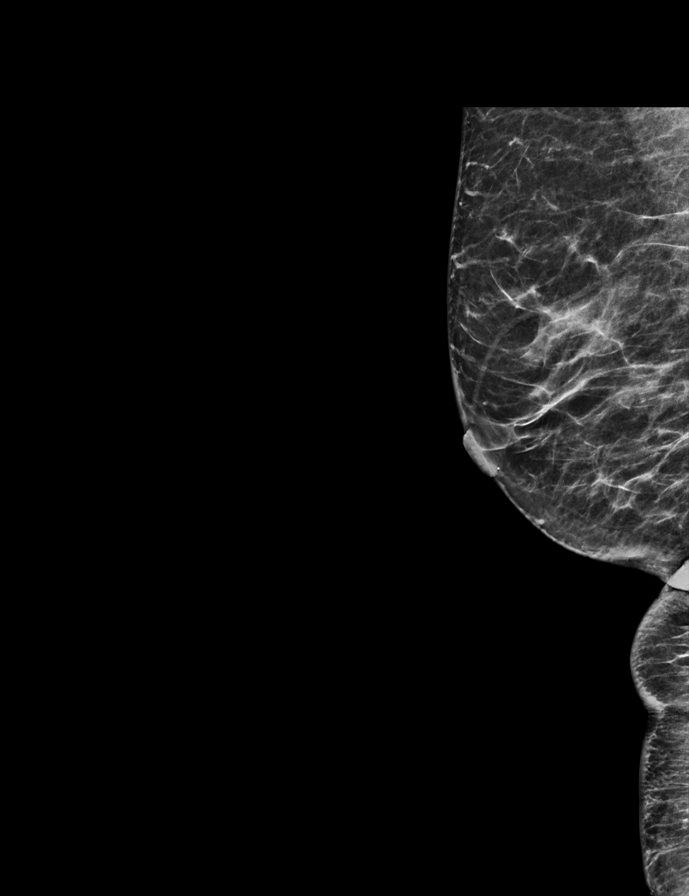

[R MLO tomo (1 of 2) · tomo slice 29/56.0]
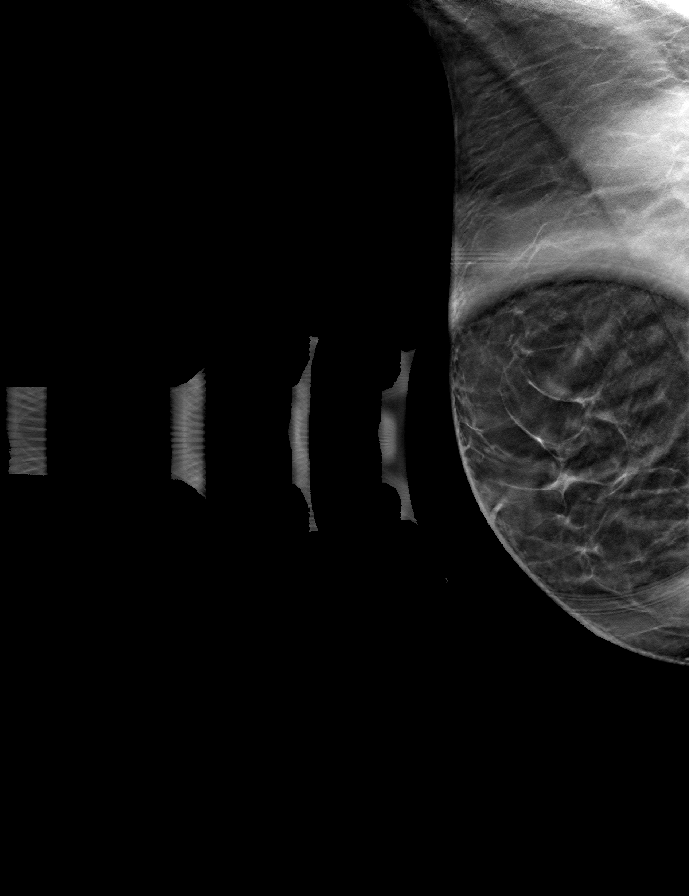

[R ML tomo (1 of 2) · tomo slice 31/60.0]
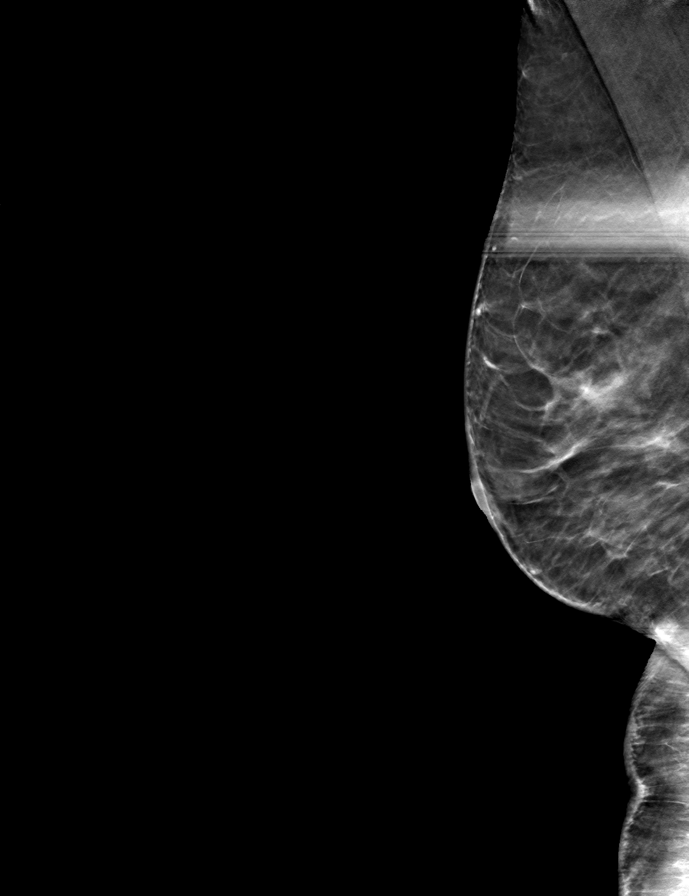

[R MLO tomo (2 of 2) · tomo slice 26/51.0]
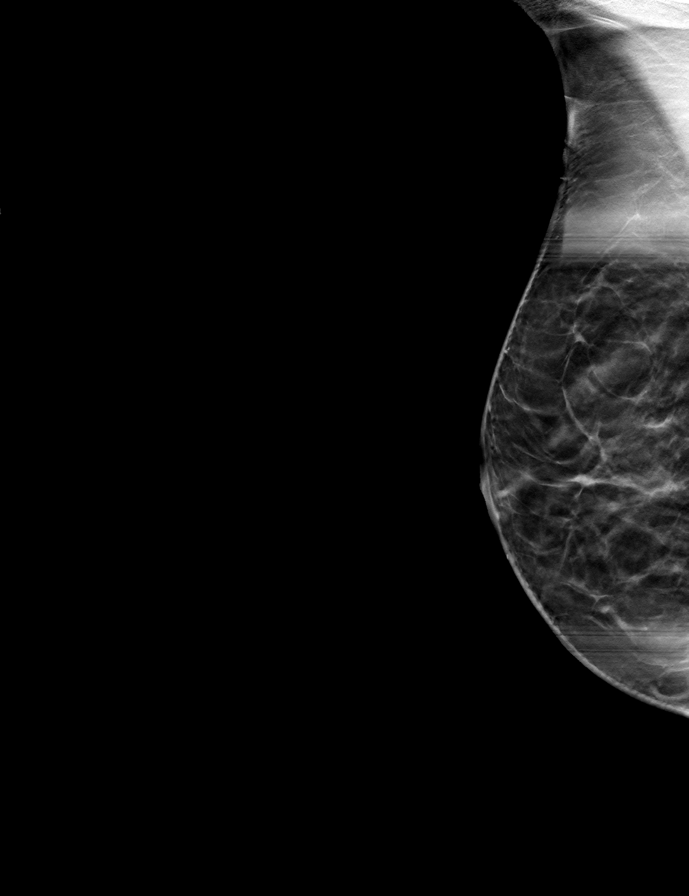

[R ML tomo (2 of 2) · tomo slice 27/54.0]
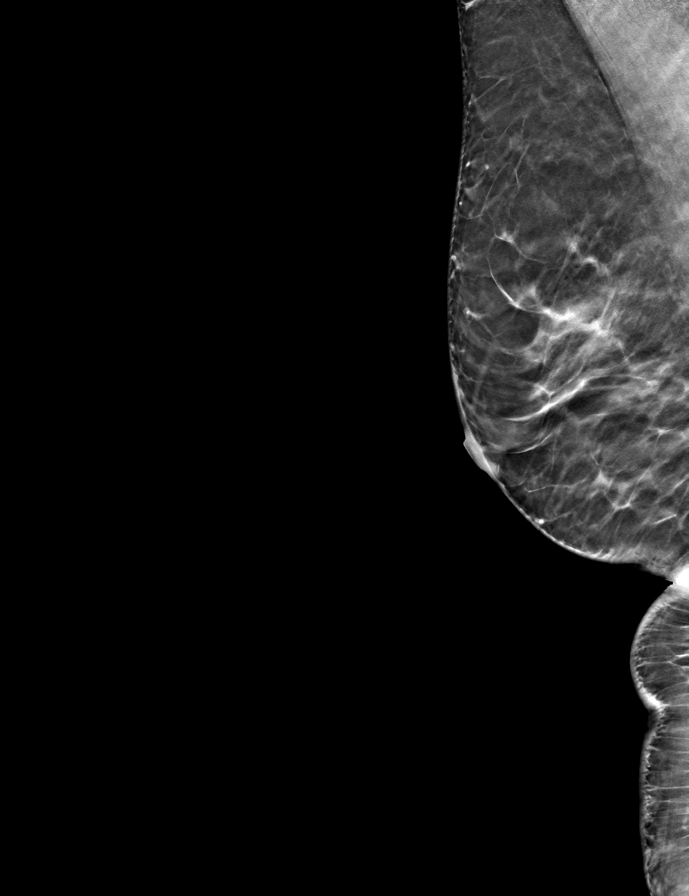

[8 of 24 positions shown; findings below may reference images not displayed]

ACR Breast Density Category b: There are scattered areas of
fibroglandular density.
FINDINGS: Additional tomograms were performed of the right breast. The
initially questioned possible right breast asymmetry is felt to
resolve on the additional imaging with findings suggestive of an
area of focally dense fibroglandular tissue.

Targeted ultrasound of the central and outer right breast was
performed. No suspicious masses or abnormalities seen, only dense
fibroglandular tissue and few scattered areas of fibrocystic change
present with a representative cyst at 9 o'clock 4 cm from nipple
measuring 0.3 x 0.2 x 0.3 cm.
IMPRESSION: No findings of malignancy in the right breast.

RECOMMENDATION:
Screening mammogram in one year.(Code:GT-A-EQ2)

I have discussed the findings and recommendations with the patient.
If applicable, a reminder letter will be sent to the patient
regarding the next appointment.

BI-RADS CATEGORY  2: Benign.

## 2023-03-11 IMAGING — US US BREAST*R* LIMITED INC AXILLA
1 series · 10 of 10 positions shown · non-contrast
Comparison: Previous exams.

CLINICAL DATA: Screening recall for possible right breast
asymmetry.

EXAM:
DIGITAL DIAGNOSTIC UNILATERAL RIGHT MAMMOGRAM WITH TOMOSYNTHESIS AND
CAD; ULTRASOUND RIGHT BREAST LIMITED
TECHNIQUE: Right digital diagnostic mammography and breast tomosynthesis was
performed. The images were evaluated with computer-aided detection.;
Targeted ultrasound examination of the right breast was performed

[Series 1: us breast*right* limited inc axilla · 0.06mm/px · 10 of 10 slices shown]
[im 1/10]
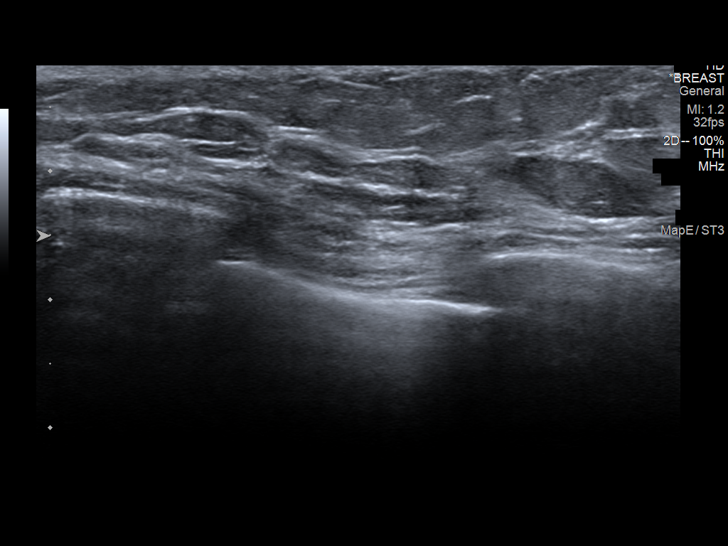
[im 2/10]
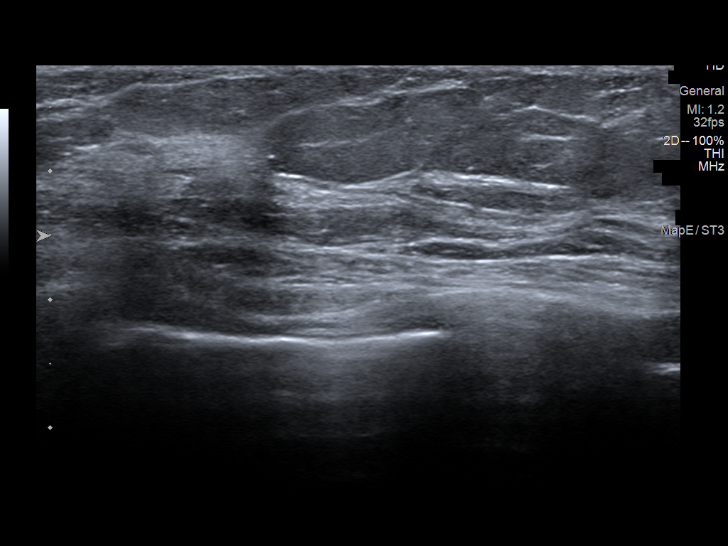
[im 3/10]
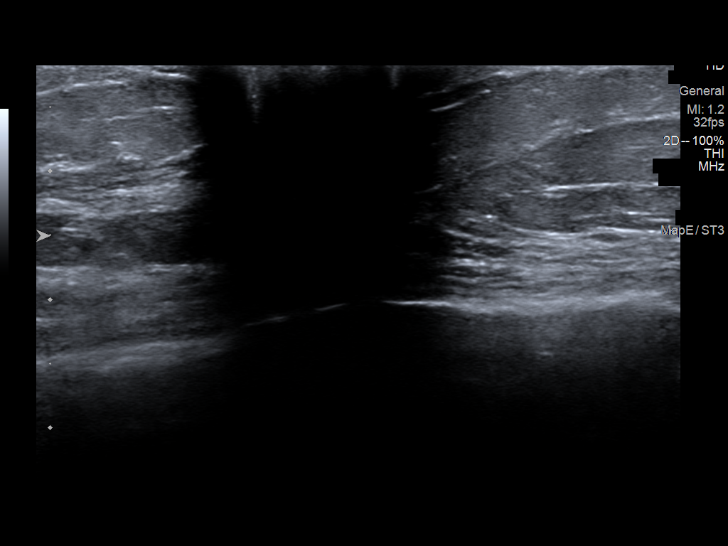
[im 4/10]
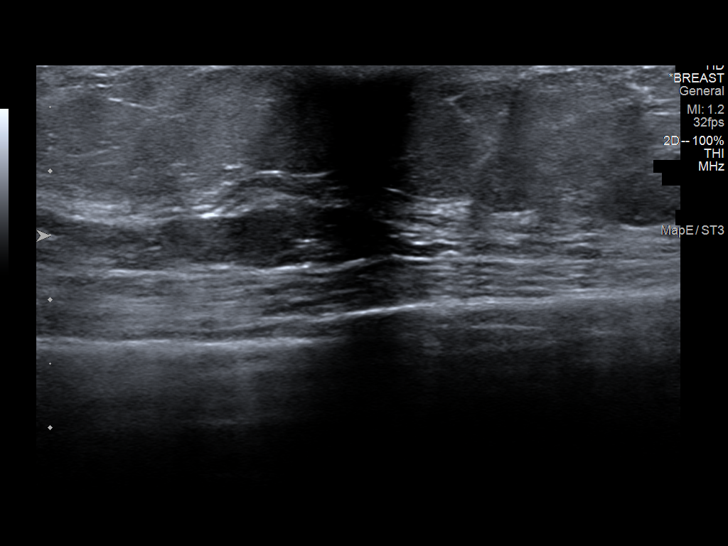
[im 5/10]
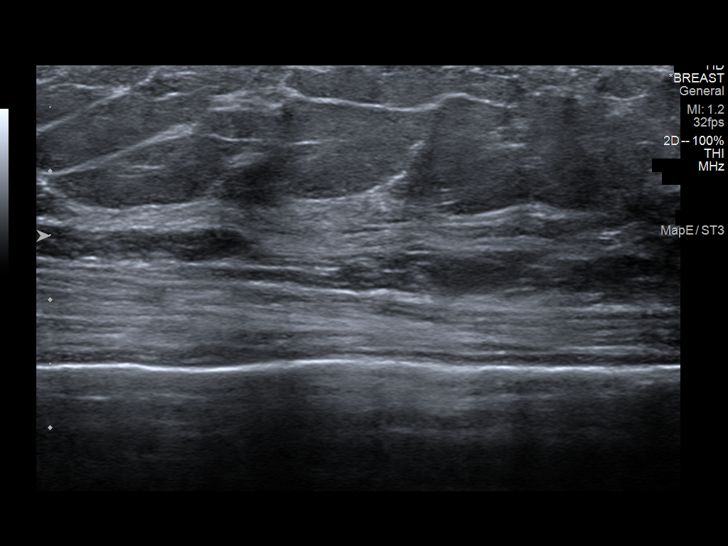
[im 6/10]
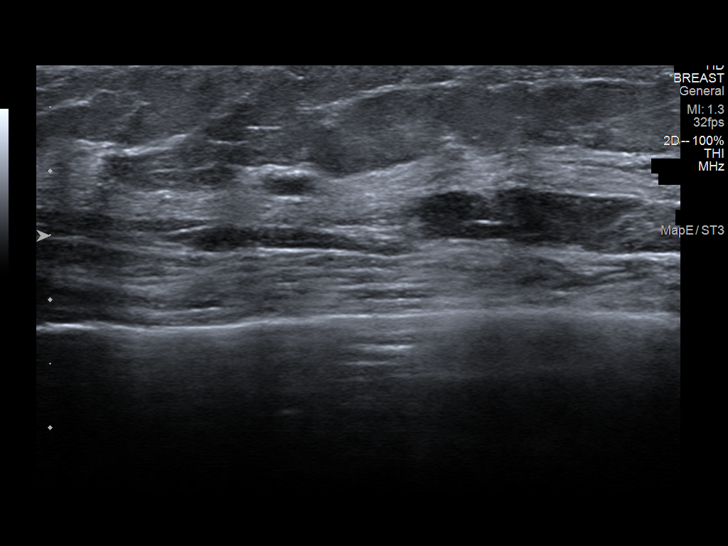
[im 7/10]
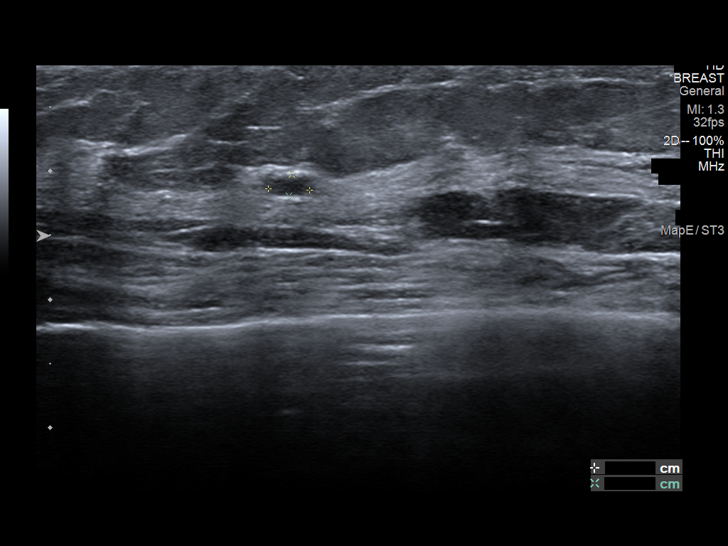
[im 8/10]
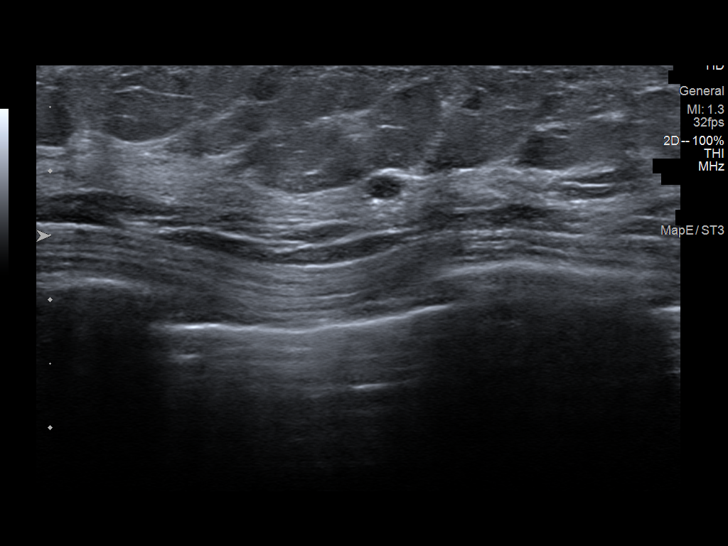
[im 9/10]
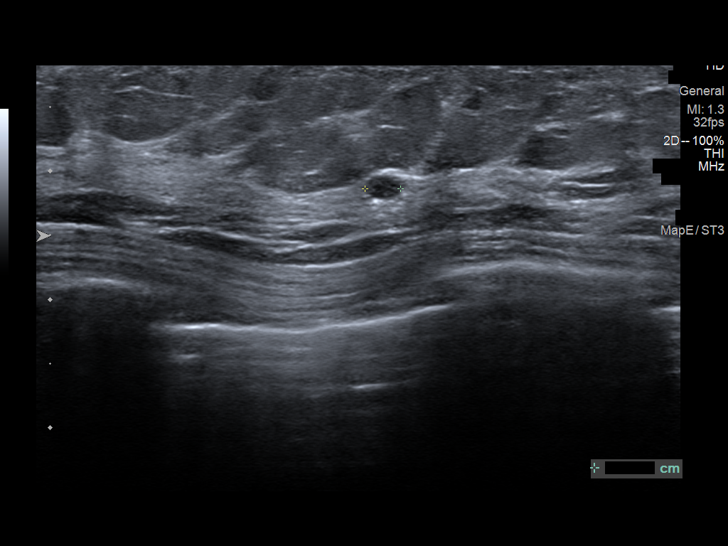
[im 10/10]
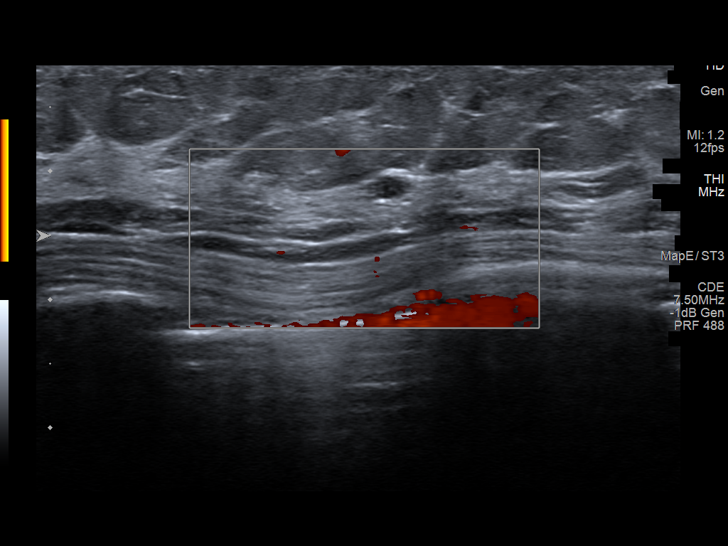

[10 of 10 positions shown; findings below may reference images not displayed]

ACR Breast Density Category b: There are scattered areas of
fibroglandular density.
FINDINGS: Additional tomograms were performed of the right breast. The
initially questioned possible right breast asymmetry is felt to
resolve on the additional imaging with findings suggestive of an
area of focally dense fibroglandular tissue.

Targeted ultrasound of the central and outer right breast was
performed. No suspicious masses or abnormalities seen, only dense
fibroglandular tissue and few scattered areas of fibrocystic change
present with a representative cyst at 9 o'clock 4 cm from nipple
measuring 0.3 x 0.2 x 0.3 cm.
IMPRESSION: No findings of malignancy in the right breast.

RECOMMENDATION:
Screening mammogram in one year.(Code:[id number]-[id number])

I have discussed the findings and recommendations with the patient.
If applicable, a reminder letter will be sent to the patient
regarding the next appointment.

BI-RADS CATEGORY  2: Benign.

## 2023-03-22 ENCOUNTER — Ambulatory Visit (INDEPENDENT_AMBULATORY_CARE_PROVIDER_SITE_OTHER): Payer: PPO

## 2023-03-22 VITALS — BP 131/84 | HR 63 | Temp 97.4°F | Resp 16 | Ht 64.0 in | Wt 130.4 lb

## 2023-03-22 DIAGNOSIS — M810 Age-related osteoporosis without current pathological fracture: Secondary | ICD-10-CM

## 2023-03-22 MED ORDER — DENOSUMAB 60 MG/ML ~~LOC~~ SOSY
60.0000 mg | PREFILLED_SYRINGE | Freq: Once | SUBCUTANEOUS | Status: AC
  Administered 2023-03-22: 60 mg via SUBCUTANEOUS
  Filled 2023-03-22: qty 1

## 2023-03-22 NOTE — Progress Notes (Signed)
Diagnosis: Osteoporosis  Provider:  Chilton Greathouse MD  Procedure: Injection  Prolia (Denosumab), Dose: 60 mg, Site: subcutaneous, Number of injections: 1  Post Care: Patient declined observation  Discharge: Condition: Good, Destination: Home . AVS Provided  Performed by:  Wyvonne Lenz, RN

## 2023-03-27 ENCOUNTER — Encounter: Payer: Self-pay | Admitting: Obstetrics & Gynecology

## 2023-04-09 DIAGNOSIS — R1030 Lower abdominal pain, unspecified: Secondary | ICD-10-CM | POA: Diagnosis not present

## 2023-04-09 DIAGNOSIS — R634 Abnormal weight loss: Secondary | ICD-10-CM | POA: Diagnosis not present

## 2023-04-09 DIAGNOSIS — K5900 Constipation, unspecified: Secondary | ICD-10-CM | POA: Diagnosis not present

## 2023-04-09 DIAGNOSIS — Z8542 Personal history of malignant neoplasm of other parts of uterus: Secondary | ICD-10-CM | POA: Diagnosis not present

## 2023-04-09 DIAGNOSIS — R110 Nausea: Secondary | ICD-10-CM | POA: Diagnosis not present

## 2023-04-19 DIAGNOSIS — Z1272 Encounter for screening for malignant neoplasm of vagina: Secondary | ICD-10-CM | POA: Diagnosis not present

## 2023-04-19 DIAGNOSIS — Z1151 Encounter for screening for human papillomavirus (HPV): Secondary | ICD-10-CM | POA: Diagnosis not present

## 2023-04-19 DIAGNOSIS — Z6822 Body mass index (BMI) 22.0-22.9, adult: Secondary | ICD-10-CM | POA: Diagnosis not present

## 2023-04-19 DIAGNOSIS — Z124 Encounter for screening for malignant neoplasm of cervix: Secondary | ICD-10-CM | POA: Diagnosis not present

## 2023-05-04 ENCOUNTER — Encounter (HOSPITAL_COMMUNITY): Payer: Self-pay

## 2023-05-04 ENCOUNTER — Emergency Department (HOSPITAL_COMMUNITY): Payer: PPO

## 2023-05-04 ENCOUNTER — Other Ambulatory Visit: Payer: Self-pay

## 2023-05-04 ENCOUNTER — Emergency Department (HOSPITAL_COMMUNITY)
Admission: EM | Admit: 2023-05-04 | Discharge: 2023-05-04 | Disposition: A | Discharge status: Home / Self Care | Payer: PPO | Attending: Emergency Medicine | Admitting: Emergency Medicine

## 2023-05-04 DIAGNOSIS — N390 Urinary tract infection, site not specified: Secondary | ICD-10-CM | POA: Diagnosis not present

## 2023-05-04 DIAGNOSIS — R109 Unspecified abdominal pain: Secondary | ICD-10-CM

## 2023-05-04 DIAGNOSIS — K529 Noninfective gastroenteritis and colitis, unspecified: Secondary | ICD-10-CM | POA: Insufficient documentation

## 2023-05-04 DIAGNOSIS — K8020 Calculus of gallbladder without cholecystitis without obstruction: Secondary | ICD-10-CM | POA: Diagnosis not present

## 2023-05-04 DIAGNOSIS — R935 Abnormal findings on diagnostic imaging of other abdominal regions, including retroperitoneum: Secondary | ICD-10-CM | POA: Diagnosis not present

## 2023-05-04 DIAGNOSIS — N281 Cyst of kidney, acquired: Secondary | ICD-10-CM | POA: Diagnosis not present

## 2023-05-04 LAB — CBC WITH DIFFERENTIAL/PLATELET
Abs Immature Granulocytes: 0.02 10*3/uL (ref 0.00–0.07)
Basophils Absolute: 0 10*3/uL (ref 0.0–0.1)
Basophils Relative: 0 %
Eosinophils Absolute: 0 10*3/uL (ref 0.0–0.5)
Eosinophils Relative: 1 %
HCT: 42.7 % (ref 36.0–46.0)
Hemoglobin: 14.1 g/dL (ref 12.0–15.0)
Immature Granulocytes: 0 %
Lymphocytes Relative: 20 %
Lymphs Abs: 1.1 10*3/uL (ref 0.7–4.0)
MCH: 29 pg (ref 26.0–34.0)
MCHC: 33 g/dL (ref 30.0–36.0)
MCV: 87.7 fL (ref 80.0–100.0)
Monocytes Absolute: 0.3 10*3/uL (ref 0.1–1.0)
Monocytes Relative: 5 %
Neutro Abs: 3.9 10*3/uL (ref 1.7–7.7)
Neutrophils Relative %: 74 %
Platelets: 219 10*3/uL (ref 150–400)
RBC: 4.87 MIL/uL (ref 3.87–5.11)
RDW: 13.2 % (ref 11.5–15.5)
WBC: 5.4 10*3/uL (ref 4.0–10.5)
nRBC: 0 % (ref 0.0–0.2)

## 2023-05-04 LAB — COMPREHENSIVE METABOLIC PANEL
ALT: 18 U/L (ref 0–44)
AST: 23 U/L (ref 15–41)
Albumin: 4.6 g/dL (ref 3.5–5.0)
Alkaline Phosphatase: 34 U/L — ABNORMAL LOW (ref 38–126)
Anion gap: 10 (ref 5–15)
BUN: 16 mg/dL (ref 8–23)
CO2: 25 mmol/L (ref 22–32)
Calcium: 10 mg/dL (ref 8.9–10.3)
Chloride: 101 mmol/L (ref 98–111)
Creatinine, Ser: 0.59 mg/dL (ref 0.44–1.00)
GFR, Estimated: >60 mL/min (ref >60)
Glucose, Bld: 115 mg/dL — ABNORMAL HIGH (ref 70–99)
Potassium: 3.5 mmol/L (ref 3.5–5.1)
Sodium: 136 mmol/L (ref 135–145)
Total Bilirubin: 1 mg/dL (ref 0.3–1.2)
Total Protein: 8.3 g/dL — ABNORMAL HIGH (ref 6.5–8.1)

## 2023-05-04 LAB — URINALYSIS, ROUTINE W REFLEX MICROSCOPIC
Bilirubin Urine: NEGATIVE
Glucose, UA: NEGATIVE mg/dL
Ketones, ur: 80 mg/dL — AB
Nitrite: NEGATIVE
Protein, ur: NEGATIVE mg/dL
Specific Gravity, Urine: 1.026 (ref 1.005–1.030)
pH: 5 (ref 5.0–8.0)

## 2023-05-04 LAB — LIPASE, BLOOD: Lipase: 32 U/L (ref 11–51)

## 2023-05-04 MED ORDER — LACTATED RINGERS IV BOLUS
1000.0000 mL | Freq: Once | INTRAVENOUS | Status: AC
  Administered 2023-05-04: 1000 mL via INTRAVENOUS

## 2023-05-04 MED ORDER — ONDANSETRON HCL 4 MG/2ML IJ SOLN
4.0000 mg | Freq: Once | INTRAMUSCULAR | Status: AC
  Administered 2023-05-04: 4 mg via INTRAVENOUS
  Filled 2023-05-04: qty 2

## 2023-05-04 MED ORDER — DICYCLOMINE HCL 20 MG PO TABS: 20.0000 mg | ORAL_TABLET | Freq: Two times a day (BID) | ORAL | 0 refills | Status: AC

## 2023-05-04 MED ORDER — FENTANYL CITRATE PF 50 MCG/ML IJ SOSY
25.0000 ug | PREFILLED_SYRINGE | Freq: Once | INTRAMUSCULAR | Status: DC
  Filled 2023-05-04: qty 1

## 2023-05-04 MED ORDER — AMOXICILLIN-POT CLAVULANATE 875-125 MG PO TABS
1.0000 | ORAL_TABLET | Freq: Two times a day (BID) | ORAL | 0 refills | Status: AC
Start: 2023-05-04 — End: ?

## 2023-05-04 MED ORDER — ONDANSETRON 4 MG PO TBDP
4.0000 mg | ORAL_TABLET | Freq: Three times a day (TID) | ORAL | 0 refills | Status: AC | PRN
Start: 2023-05-04 — End: ?

## 2023-05-04 MED ORDER — IOHEXOL 300 MG/ML  SOLN
100.0000 mL | Freq: Once | INTRAMUSCULAR | Status: AC | PRN
  Administered 2023-05-04: 100 mL via INTRAVENOUS

## 2023-05-04 MED ORDER — KETOROLAC TROMETHAMINE 15 MG/ML IJ SOLN
15.0000 mg | Freq: Once | INTRAMUSCULAR | Status: AC
  Administered 2023-05-04: 15 mg via INTRAVENOUS
  Filled 2023-05-04: qty 1

## 2023-05-04 NOTE — ED Notes (Signed)
Patient transported to CT 

## 2023-05-04 NOTE — Discharge Instructions (Addendum)
your workup today shows evidence of colitis as well as UTI.  Will send a urine culture to confirm.  Follow-up with your PCP.  Have also given information for gastroenterologist.  For any concerning symptoms return to the emergency room.

## 2023-05-04 NOTE — ED Triage Notes (Addendum)
Pt states that she abdominal pain (with n/v) that has been going on for a while now. Pt states that the pain came back today and was told to come to the ED if it kept happening.

## 2023-05-04 NOTE — ED Provider Notes (Signed)
[place] EMERGENCY DEPARTMENT AT [facility] Provider Note   CSN: [id number] Arrival date & time: 05/04/23  2027     History  Chief Complaint  Patient presents with   Abdominal Pain    Stephanie Mahoney is a 72 y.o. female.  72 year old female presents today for concern of abdominal pain.  She states this has been an intermittent issue for quite some time.  She was recently seen by PCP and was told to take Metamucil and if it occurred again she will come to the emergency room.  She was offered a CT scan at that time but she deferred.  Endorses 1 episode of emesis.  Denies any rectal bleeding, hematemesis, dysuria, flank pain.  The history is provided by the patient. No language interpreter was used.       Home Medications Prior to Admission medications   Medication Sig Start Date End Date Taking? Authorizing Provider  acetaminophen (TYLENOL) 500 MG tablet Take 500 mg by mouth every 6 (six) hours as needed for moderate pain or headache.    [provider]  albuterol (VENTOLIN HFA) 108 (90 Base) MCG/ACT inhaler Inhale 1-2 puffs into the lungs every 6 (six) hours as needed for wheezing or shortness of breath. 11/17/22   Wallis Bamberg, PA-C  Calcium Carbonate-Vitamin D (CALCIUM-VITAMIN D3 PO) Take 1 tablet by mouth daily.    [provider]  celecoxib (CELEBREX) 100 MG capsule Take 100 mg by mouth daily. 05/09/21   [provider]  cetirizine HCl (ZYRTEC) 1 MG/ML solution Take 5 mLs (5 mg total) by mouth daily. 11/17/22   Wallis Bamberg, PA-C  Cholecalciferol 100 MCG (4000 UT) CAPS Take 4,000 Units by mouth daily.    [provider]  cyclobenzaprine (FLEXERIL) 5 MG tablet Take 5 mg by mouth 3 (three) times daily as needed for muscle spasms.     [provider]  estradiol (ESTRACE) 0.1 MG/GM vaginal cream Use berry sized amount nightly PV x 2 weeks then QOD. 09/22/19   [provider]  estradiol (VIVELLE-DOT) 0.1 MG/24HR patch Place  0.5 patches onto the skin 2 (two) times a week. 08/01/20   [provider]  meclizine (ANTIVERT) 25 MG tablet Take 1 tablet (25 mg total) by mouth 3 (three) times daily as needed for dizziness. 01/15/21   Jacalyn Lefevre, MD  ondansetron (ZOFRAN ODT) 4 MG disintegrating tablet Take 1 tablet (4 mg total) by mouth every 8 (eight) hours as needed for nausea or vomiting. 01/15/21   Jacalyn Lefevre, MD  promethazine-dextromethorphan (PROMETHAZINE-DM) 6.25-15 MG/5ML syrup Take 2.5 mLs by mouth 3 (three) times daily as needed for cough. 11/17/22   Wallis Bamberg, PA-C      Allergies    Lanolin and Naproxen    Review of Systems   Review of Systems  Constitutional:  Negative for chills and fever.  Respiratory:  Negative for shortness of breath.   Gastrointestinal:  Positive for abdominal pain, nausea and vomiting. Negative for constipation and diarrhea.  Genitourinary:  Negative for dysuria.  Neurological:  Negative for light-headedness.  All other systems reviewed and are negative.   Physical Exam Updated Vital Signs BP 130/69 (BP Location: Left Arm)   Pulse 61   Temp (!) 97.4 F (36.3 C) (Oral)   Resp 14   Ht 5\' 4"  (1.626 m)   Wt 59.1 kg   SpO2 100%   BMI 22.36 kg/m  Physical Exam Vitals and nursing note reviewed.  Constitutional:  General: She is not in acute distress.    Appearance: Normal appearance. She is not ill-appearing.  HENT:     Head: Normocephalic and atraumatic.     Nose: Nose normal.  Eyes:     Conjunctiva/sclera: Conjunctivae normal.  Cardiovascular:     Rate and Rhythm: Normal rate.  Pulmonary:     Effort: Pulmonary effort is normal. No respiratory distress.  Abdominal:     General: There is no distension.     Palpations: Abdomen is soft.     Tenderness: There is abdominal tenderness. There is no right CVA tenderness, left CVA tenderness, guarding or rebound.  Musculoskeletal:        General: No deformity. Normal range of motion.  Skin:    Findings:  No rash.  Neurological:     Mental Status: She is alert.     ED Results / Procedures / Treatments   Labs (all labs ordered are listed, but only abnormal results are displayed) Labs Reviewed  COMPREHENSIVE METABOLIC PANEL - Abnormal; Notable for the following components:      Result Value   Glucose, Bld 115 (*)    Total Protein 8.3 (*)    Alkaline Phosphatase 34 (*)    All other components within normal limits  URINALYSIS, ROUTINE W REFLEX MICROSCOPIC - Abnormal; Notable for the following components:   APPearance HAZY (*)    Hgb urine dipstick SMALL (*)    Ketones, ur 80 (*)    Leukocytes,Ua LARGE (*)    Bacteria, UA RARE (*)    All other components within normal limits  URINE CULTURE  LIPASE, BLOOD  CBC WITH DIFFERENTIAL/PLATELET    EKG None  Radiology CT ABDOMEN PELVIS W CONTRAST  Result Date: 05/04/2023 CLINICAL DATA:  Abdominal pain with nausea and vomiting. EXAM: CT ABDOMEN AND PELVIS WITH CONTRAST TECHNIQUE: Multidetector CT imaging of the abdomen and pelvis was performed using the standard protocol following bolus administration of intravenous contrast. RADIATION DOSE REDUCTION: This exam was performed according to the departmental dose-optimization program which includes automated exposure control, adjustment of the mA and/or kV according to patient size and/or use of iterative reconstruction technique. CONTRAST:  OMNIPAQUE IOHEXOL 300 MG/ML  SOLN COMPARISON:  06/11/2022. FINDINGS: Lower chest: Heart is normal in size and there is a small pericardial effusion. Atelectasis or scarring is present at the lung bases. Hepatobiliary: No focal liver abnormality is seen. No biliary ductal dilatation. A stone is present within the gallbladder. Pancreas: Unremarkable. No pancreatic ductal dilatation or surrounding inflammatory changes. Spleen: Normal in size without focal abnormality. Adrenals/Urinary Tract: The adrenal glands are within normal limits. The kidneys enhance  symmetrically. Subcentimeter hypodensities are present in the right kidney which are too small to further characterize. A stable cyst is present in the left kidney. No renal calculus or hydronephrosis the bladder is decompressed. Stomach/Bowel: Stomach is within normal limits. Appendix is not seen. No evidence of bowel wall thickening or distention. Ileocolic junction and cecum are located in the central pelvis, compatible with lax ileocolic mesentery. Evaluation of the bowel is limited due to lack of oral contrast. No free air or pneumatosis. Vascular/Lymphatic: No significant vascular findings are present. No enlarged abdominal or pelvic lymph nodes. Reproductive: Status post hysterectomy. No adnexal masses. Other: Small amount of ascites is noted in the pelvis and mesenteric folds. Musculoskeletal: Degenerative changes are present in the thoracolumbar spine. Scoliosis is noted. No acute osseous abnormality is seen. IMPRESSION: 1. Mild fat stranding and a small amount of free  fluid in the pelvis about the cecum, which may be infectious or inflammatory. No bowel obstruction or pneumatosis is seen. 2. Cholelithiasis. Electronically Signed   By: Thornell Sartorius M.D.   On: 05/04/2023 22:40    Procedures Procedures    Medications Ordered in ED Medications  fentaNYL (SUBLIMAZE) injection 25 mcg (25 mcg Intravenous Patient Refused/Not Given 05/04/23 2153)  lactated ringers bolus 1,000 mL (1,000 mLs Intravenous New Bag/Given 05/04/23 2153)  ondansetron (ZOFRAN) injection 4 mg (4 mg Intravenous Given 05/04/23 2144)  iohexol (OMNIPAQUE) 300 MG/ML solution 100 mL (100 mLs Intravenous Contrast Given 05/04/23 2154)  ketorolac (TORADOL) 15 MG/ML injection 15 mg (15 mg Intravenous Given 05/04/23 2207)    ED Course/ Medical Decision Making/ A&P                                 Medical Decision Making Amount and/or Complexity of Data Reviewed Labs: ordered. Radiology: ordered.  Risk Prescription drug  management.   72 year old female presents today for concern of generalized abdominal pain.  This has been an intermittent issue for quite some time.  She has tried Metamucil.  Endorses generalized abdominal pain, 1 episode of emesis.  No other GU symptoms.  Will obtain labs, provide fluid bolus, pain medication, and obtain CT imaging.  CBC unremarkable.  CMP shows glucose 115 otherwise without acute concern.  UA shows large leukocytes, 21-50 WBCs.  Will send urine culture but treat for UTI.  Lipase within normal.  CT abdomen pelvis with contrast does show some fat stranding around the cecum.  This could be infectious or inflammatory according to the read.  Will start patient on Augmentin and this will cover both UTI as well as the potential colitis. Discharged in stable condition.  GI referral given.  Patient voices understanding and is in agreement with plan.   Final Clinical Impression(s) / ED Diagnoses Final diagnoses:  Abdominal pain, unspecified abdominal location  Urinary tract infection without hematuria, site unspecified  Colitis    Rx / DC Orders ED Discharge Orders          Ordered    amoxicillin-clavulanate (AUGMENTIN) 875-125 MG tablet  Every 12 hours        05/04/23 2308    dicyclomine (BENTYL) 20 MG tablet  2 times daily        05/04/23 2308    ondansetron (ZOFRAN-ODT) 4 MG disintegrating tablet  Every 8 hours PRN        05/04/23 2308              Marita Kansas, PA-C 05/04/23 2308    Horton, Clabe Seal, DO 05/05/23 1512

## 2023-05-06 DIAGNOSIS — N390 Urinary tract infection, site not specified: Secondary | ICD-10-CM | POA: Diagnosis not present

## 2023-05-06 DIAGNOSIS — K529 Noninfective gastroenteritis and colitis, unspecified: Secondary | ICD-10-CM | POA: Diagnosis not present

## 2023-05-06 DIAGNOSIS — K8020 Calculus of gallbladder without cholecystitis without obstruction: Secondary | ICD-10-CM | POA: Diagnosis not present

## 2023-05-06 LAB — URINE CULTURE

## 2023-05-08 ENCOUNTER — Encounter (HOSPITAL_COMMUNITY): Admission: EM | Disposition: A | Discharge status: Home / Self Care | Payer: Self-pay | Source: Home / Self Care

## 2023-05-08 ENCOUNTER — Emergency Department (HOSPITAL_COMMUNITY): Payer: PPO | Admitting: Certified Registered"

## 2023-05-08 ENCOUNTER — Other Ambulatory Visit: Payer: Self-pay

## 2023-05-08 ENCOUNTER — Encounter (HOSPITAL_COMMUNITY): Payer: Self-pay

## 2023-05-08 ENCOUNTER — Inpatient Hospital Stay (HOSPITAL_COMMUNITY)
Admission: EM | Admit: 2023-05-08 | Discharge: 2023-05-11 | DRG: 331 | Disposition: A | Discharge status: Home / Self Care | Payer: PPO | Attending: General Surgery | Admitting: General Surgery

## 2023-05-08 ENCOUNTER — Emergency Department (HOSPITAL_COMMUNITY): Payer: PPO

## 2023-05-08 DIAGNOSIS — K8020 Calculus of gallbladder without cholecystitis without obstruction: Secondary | ICD-10-CM | POA: Diagnosis not present

## 2023-05-08 DIAGNOSIS — I341 Nonrheumatic mitral (valve) prolapse: Secondary | ICD-10-CM | POA: Diagnosis not present

## 2023-05-08 DIAGNOSIS — Z8249 Family history of ischemic heart disease and other diseases of the circulatory system: Secondary | ICD-10-CM

## 2023-05-08 DIAGNOSIS — R9431 Abnormal electrocardiogram [ECG] [EKG]: Secondary | ICD-10-CM | POA: Diagnosis not present

## 2023-05-08 DIAGNOSIS — K562 Volvulus: Secondary | ICD-10-CM

## 2023-05-08 DIAGNOSIS — M419 Scoliosis, unspecified: Secondary | ICD-10-CM | POA: Diagnosis not present

## 2023-05-08 DIAGNOSIS — K2970 Gastritis, unspecified, without bleeding: Secondary | ICD-10-CM | POA: Diagnosis not present

## 2023-05-08 DIAGNOSIS — E559 Vitamin D deficiency, unspecified: Secondary | ICD-10-CM | POA: Diagnosis present

## 2023-05-08 DIAGNOSIS — Z9842 Cataract extraction status, left eye: Secondary | ICD-10-CM | POA: Diagnosis not present

## 2023-05-08 DIAGNOSIS — Z8542 Personal history of malignant neoplasm of other parts of uterus: Secondary | ICD-10-CM

## 2023-05-08 DIAGNOSIS — K5669 Other partial intestinal obstruction: Secondary | ICD-10-CM | POA: Diagnosis not present

## 2023-05-08 DIAGNOSIS — Z888 Allergy status to other drugs, medicaments and biological substances status: Secondary | ICD-10-CM

## 2023-05-08 DIAGNOSIS — Z9841 Cataract extraction status, right eye: Secondary | ICD-10-CM

## 2023-05-08 DIAGNOSIS — Z8262 Family history of osteoporosis: Secondary | ICD-10-CM | POA: Diagnosis not present

## 2023-05-08 DIAGNOSIS — I7300 Raynaud's syndrome without gangrene: Secondary | ICD-10-CM | POA: Diagnosis not present

## 2023-05-08 DIAGNOSIS — Z90710 Acquired absence of both cervix and uterus: Secondary | ICD-10-CM

## 2023-05-08 DIAGNOSIS — Z90722 Acquired absence of ovaries, bilateral: Secondary | ICD-10-CM | POA: Diagnosis not present

## 2023-05-08 DIAGNOSIS — K922 Gastrointestinal hemorrhage, unspecified: Secondary | ICD-10-CM | POA: Diagnosis not present

## 2023-05-08 DIAGNOSIS — Z981 Arthrodesis status: Secondary | ICD-10-CM | POA: Diagnosis not present

## 2023-05-08 DIAGNOSIS — K6389 Other specified diseases of intestine: Secondary | ICD-10-CM | POA: Diagnosis not present

## 2023-05-08 DIAGNOSIS — R109 Unspecified abdominal pain: Secondary | ICD-10-CM | POA: Diagnosis not present

## 2023-05-08 DIAGNOSIS — Z823 Family history of stroke: Secondary | ICD-10-CM | POA: Diagnosis not present

## 2023-05-08 DIAGNOSIS — Z803 Family history of malignant neoplasm of breast: Secondary | ICD-10-CM | POA: Diagnosis not present

## 2023-05-08 DIAGNOSIS — Z9079 Acquired absence of other genital organ(s): Secondary | ICD-10-CM

## 2023-05-08 DIAGNOSIS — K838 Other specified diseases of biliary tract: Secondary | ICD-10-CM | POA: Diagnosis not present

## 2023-05-08 DIAGNOSIS — K633 Ulcer of intestine: Secondary | ICD-10-CM | POA: Diagnosis not present

## 2023-05-08 HISTORY — PX: LAPAROSCOPY: SHX197

## 2023-05-08 LAB — CBC WITH DIFFERENTIAL/PLATELET
Abs Immature Granulocytes: 0.06 10*3/uL (ref 0.00–0.07)
Basophils Absolute: 0 10*3/uL (ref 0.0–0.1)
Basophils Relative: 0 %
Eosinophils Absolute: 0 10*3/uL (ref 0.0–0.5)
Eosinophils Relative: 0 %
HCT: 43.1 % (ref 36.0–46.0)
Hemoglobin: 14.3 g/dL (ref 12.0–15.0)
Immature Granulocytes: 1 %
Lymphocytes Relative: 11 %
Lymphs Abs: 1.2 10*3/uL (ref 0.7–4.0)
MCH: 28.5 pg (ref 26.0–34.0)
MCHC: 33.2 g/dL (ref 30.0–36.0)
MCV: 86 fL (ref 80.0–100.0)
Monocytes Absolute: 0.8 10*3/uL (ref 0.1–1.0)
Monocytes Relative: 7 %
Neutro Abs: 9.2 10*3/uL — ABNORMAL HIGH (ref 1.7–7.7)
Neutrophils Relative %: 81 %
Platelets: 299 10*3/uL (ref 150–400)
RBC: 5.01 MIL/uL (ref 3.87–5.11)
RDW: 13.2 % (ref 11.5–15.5)
WBC: 11.3 10*3/uL — ABNORMAL HIGH (ref 4.0–10.5)
nRBC: 0 % (ref 0.0–0.2)

## 2023-05-08 LAB — COMPREHENSIVE METABOLIC PANEL
ALT: 17 U/L (ref 0–44)
AST: 20 U/L (ref 15–41)
Albumin: 4.1 g/dL (ref 3.5–5.0)
Alkaline Phosphatase: 37 U/L — ABNORMAL LOW (ref 38–126)
Anion gap: 13 (ref 5–15)
BUN: 15 mg/dL (ref 8–23)
CO2: 20 mmol/L — ABNORMAL LOW (ref 22–32)
Calcium: 8.4 mg/dL — ABNORMAL LOW (ref 8.9–10.3)
Chloride: 99 mmol/L (ref 98–111)
Creatinine, Ser: 0.71 mg/dL (ref 0.44–1.00)
GFR, Estimated: >60 mL/min (ref >60)
Glucose, Bld: 94 mg/dL (ref 70–99)
Potassium: 3 mmol/L — ABNORMAL LOW (ref 3.5–5.1)
Sodium: 132 mmol/L — ABNORMAL LOW (ref 135–145)
Total Bilirubin: 1.3 mg/dL — ABNORMAL HIGH (ref 0.3–1.2)
Total Protein: 7.6 g/dL (ref 6.5–8.1)

## 2023-05-08 LAB — URINALYSIS, ROUTINE W REFLEX MICROSCOPIC
Bacteria, UA: NONE SEEN
Bilirubin Urine: NEGATIVE
Glucose, UA: NEGATIVE mg/dL
Ketones, ur: 80 mg/dL — AB
Nitrite: NEGATIVE
Protein, ur: NEGATIVE mg/dL
Specific Gravity, Urine: >1.046 — ABNORMAL HIGH (ref 1.005–1.030)
pH: 6 (ref 5.0–8.0)

## 2023-05-08 LAB — CREATININE, SERUM
Creatinine, Ser: 0.6 mg/dL (ref 0.44–1.00)
GFR, Estimated: >60 mL/min (ref >60)

## 2023-05-08 LAB — CBC
HCT: 44.1 % (ref 36.0–46.0)
Hemoglobin: 14 g/dL (ref 12.0–15.0)
MCH: 29.1 pg (ref 26.0–34.0)
MCHC: 31.7 g/dL (ref 30.0–36.0)
MCV: 91.7 fL (ref 80.0–100.0)
Platelets: 237 10*3/uL (ref 150–400)
RBC: 4.81 MIL/uL (ref 3.87–5.11)
RDW: 13.5 % (ref 11.5–15.5)
WBC: 10.4 10*3/uL (ref 4.0–10.5)
nRBC: 0 % (ref 0.0–0.2)

## 2023-05-08 LAB — LIPASE, BLOOD: Lipase: 39 U/L (ref 11–51)

## 2023-05-08 SURGERY — LAPAROSCOPY, DIAGNOSTIC
Anesthesia: General

## 2023-05-08 MED ORDER — SUGAMMADEX SODIUM 200 MG/2ML IV SOLN
INTRAVENOUS | Status: DC | PRN
  Administered 2023-05-08: 200 mg via INTRAVENOUS

## 2023-05-08 MED ORDER — SODIUM CHLORIDE 0.9 % IV SOLN: INTRAVENOUS | Status: DC

## 2023-05-08 MED ORDER — DEXAMETHASONE SODIUM PHOSPHATE 10 MG/ML IJ SOLN
INTRAMUSCULAR | Status: DC | PRN
  Administered 2023-05-08: 8 mg via INTRAVENOUS

## 2023-05-08 MED ORDER — 0.9 % SODIUM CHLORIDE (POUR BTL) OPTIME
TOPICAL | Status: DC | PRN
Start: 2023-05-08 — End: 2023-05-08
  Administered 2023-05-08: 3000 mL

## 2023-05-08 MED ORDER — METOPROLOL TARTRATE 5 MG/5ML IV SOLN: 5.0000 mg | Freq: Four times a day (QID) | INTRAVENOUS | Status: DC | PRN

## 2023-05-08 MED ORDER — SODIUM CHLORIDE 0.9 % IV SOLN
2.0000 g | INTRAVENOUS | Status: AC
  Administered 2023-05-08: 2 g via INTRAVENOUS
  Filled 2023-05-08: qty 2

## 2023-05-08 MED ORDER — SODIUM CHLORIDE 0.45 % IV SOLN: INTRAVENOUS | Status: DC

## 2023-05-08 MED ORDER — HYDROMORPHONE HCL 1 MG/ML IJ SOLN
0.2500 mg | INTRAMUSCULAR | Status: DC | PRN
  Administered 2023-05-08 (×2): 0.5 mg via INTRAVENOUS

## 2023-05-08 MED ORDER — FENTANYL CITRATE (PF) 100 MCG/2ML IJ SOLN
INTRAMUSCULAR | Status: DC | PRN
  Administered 2023-05-08 (×2): 50 ug via INTRAVENOUS

## 2023-05-08 MED ORDER — DIPHENHYDRAMINE HCL 12.5 MG/5ML PO ELIX: 12.5000 mg | ORAL_SOLUTION | Freq: Four times a day (QID) | ORAL | Status: DC | PRN

## 2023-05-08 MED ORDER — ROCURONIUM BROMIDE 10 MG/ML (PF) SYRINGE
PREFILLED_SYRINGE | INTRAVENOUS | Status: AC
  Filled 2023-05-08: qty 10

## 2023-05-08 MED ORDER — SUCCINYLCHOLINE CHLORIDE 200 MG/10ML IV SOSY
PREFILLED_SYRINGE | INTRAVENOUS | Status: AC
  Filled 2023-05-08: qty 10

## 2023-05-08 MED ORDER — SODIUM CHLORIDE 0.9 % IV BOLUS
1000.0000 mL | Freq: Once | INTRAVENOUS | Status: AC
  Administered 2023-05-08: 1000 mL via INTRAVENOUS

## 2023-05-08 MED ORDER — FENTANYL CITRATE (PF) 100 MCG/2ML IJ SOLN
INTRAMUSCULAR | Status: AC
  Filled 2023-05-08: qty 2

## 2023-05-08 MED ORDER — LIDOCAINE HCL (PF) 2 % IJ SOLN
INTRAMUSCULAR | Status: AC
  Filled 2023-05-08: qty 5

## 2023-05-08 MED ORDER — MORPHINE SULFATE (PF) 2 MG/ML IV SOLN: 2.0000 mg | INTRAVENOUS | Status: DC | PRN

## 2023-05-08 MED ORDER — ONDANSETRON HCL 4 MG PO TABS: 4.0000 mg | ORAL_TABLET | Freq: Four times a day (QID) | ORAL | Status: DC | PRN

## 2023-05-08 MED ORDER — IOHEXOL 300 MG/ML  SOLN
100.0000 mL | Freq: Once | INTRAMUSCULAR | Status: AC | PRN
  Administered 2023-05-08: 75 mL via INTRAVENOUS

## 2023-05-08 MED ORDER — MORPHINE SULFATE (PF) 4 MG/ML IV SOLN
4.0000 mg | Freq: Once | INTRAVENOUS | Status: AC
  Administered 2023-05-08: 4 mg via INTRAVENOUS
  Filled 2023-05-08: qty 1

## 2023-05-08 MED ORDER — ONDANSETRON HCL 4 MG/2ML IJ SOLN
4.0000 mg | Freq: Once | INTRAMUSCULAR | Status: AC
  Administered 2023-05-08: 4 mg via INTRAVENOUS
  Filled 2023-05-08: qty 2

## 2023-05-08 MED ORDER — KETAMINE HCL 50 MG/5ML IJ SOSY
PREFILLED_SYRINGE | INTRAMUSCULAR | Status: AC
  Filled 2023-05-08: qty 5

## 2023-05-08 MED ORDER — ACETAMINOPHEN 500 MG PO TABS
1000.0000 mg | ORAL_TABLET | Freq: Four times a day (QID) | ORAL | Status: DC
  Administered 2023-05-08 – 2023-05-09 (×4): 1000 mg via ORAL
  Filled 2023-05-08 (×9): qty 2

## 2023-05-08 MED ORDER — LACTATED RINGERS IV SOLN
INTRAVENOUS | Status: DC | PRN
Start: 2023-05-08 — End: 2023-05-08

## 2023-05-08 MED ORDER — SUCCINYLCHOLINE CHLORIDE 200 MG/10ML IV SOSY
PREFILLED_SYRINGE | INTRAVENOUS | Status: DC | PRN
Start: 2023-05-08 — End: 2023-05-08
  Administered 2023-05-08: 100 mg via INTRAVENOUS

## 2023-05-08 MED ORDER — LACTATED RINGERS IV SOLN: INTRAVENOUS | Status: DC

## 2023-05-08 MED ORDER — ENSURE SURGERY PO LIQD
237.0000 mL | Freq: Two times a day (BID) | ORAL | Status: DC
  Administered 2023-05-09 – 2023-05-10 (×2): 237 mL via ORAL

## 2023-05-08 MED ORDER — PROPOFOL 10 MG/ML IV BOLUS
INTRAVENOUS | Status: AC
  Filled 2023-05-08: qty 20

## 2023-05-08 MED ORDER — ONDANSETRON HCL 4 MG/2ML IJ SOLN: 4.0000 mg | Freq: Four times a day (QID) | INTRAMUSCULAR | Status: DC | PRN

## 2023-05-08 MED ORDER — DEXAMETHASONE SODIUM PHOSPHATE 10 MG/ML IJ SOLN
INTRAMUSCULAR | Status: AC
  Filled 2023-05-08: qty 1

## 2023-05-08 MED ORDER — PHENYLEPHRINE HCL (PRESSORS) 10 MG/ML IV SOLN
INTRAVENOUS | Status: DC | PRN
  Administered 2023-05-08: 100 ug via INTRAVENOUS

## 2023-05-08 MED ORDER — HYDROMORPHONE HCL 1 MG/ML IJ SOLN
INTRAMUSCULAR | Status: AC
  Filled 2023-05-08: qty 1

## 2023-05-08 MED ORDER — SIMETHICONE 80 MG PO CHEW
40.0000 mg | CHEWABLE_TABLET | Freq: Four times a day (QID) | ORAL | Status: DC | PRN
  Administered 2023-05-09: 40 mg via ORAL
  Filled 2023-05-08: qty 1

## 2023-05-08 MED ORDER — DIPHENHYDRAMINE HCL 50 MG/ML IJ SOLN: 12.5000 mg | Freq: Four times a day (QID) | INTRAMUSCULAR | Status: DC | PRN

## 2023-05-08 MED ORDER — ROCURONIUM BROMIDE 100 MG/10ML IV SOLN
INTRAVENOUS | Status: DC | PRN
  Administered 2023-05-08: 40 mg via INTRAVENOUS

## 2023-05-08 MED ORDER — SODIUM CHLORIDE (PF) 0.9 % IJ SOLN
INTRAMUSCULAR | Status: AC
  Filled 2023-05-08: qty 50

## 2023-05-08 MED ORDER — ONDANSETRON HCL 4 MG/2ML IJ SOLN
INTRAMUSCULAR | Status: DC | PRN
  Administered 2023-05-08: 4 mg via INTRAVENOUS

## 2023-05-08 MED ORDER — OXYCODONE HCL 5 MG PO TABS: 5.0000 mg | ORAL_TABLET | Freq: Four times a day (QID) | ORAL | Status: DC | PRN

## 2023-05-08 MED ORDER — DROPERIDOL 2.5 MG/ML IJ SOLN: 0.6250 mg | Freq: Once | INTRAMUSCULAR | Status: DC | PRN

## 2023-05-08 MED ORDER — POTASSIUM CHLORIDE 10 MEQ/100ML IV SOLN: 10.0000 meq | INTRAVENOUS | Status: AC

## 2023-05-08 MED ORDER — PROPOFOL 10 MG/ML IV BOLUS
INTRAVENOUS | Status: DC | PRN
  Administered 2023-05-08: 100 mg via INTRAVENOUS

## 2023-05-08 MED ORDER — LIDOCAINE HCL (CARDIAC) PF 100 MG/5ML IV SOSY
PREFILLED_SYRINGE | INTRAVENOUS | Status: DC | PRN
  Administered 2023-05-08: 50 mg via INTRAVENOUS

## 2023-05-08 MED ORDER — ONDANSETRON HCL 4 MG/2ML IJ SOLN
INTRAMUSCULAR | Status: AC
  Filled 2023-05-08: qty 2

## 2023-05-08 MED ORDER — KETAMINE HCL 10 MG/ML IJ SOLN
INTRAMUSCULAR | Status: DC | PRN
  Administered 2023-05-08: 20 mg via INTRAVENOUS
  Administered 2023-05-08: 30 mg via INTRAVENOUS

## 2023-05-08 MED ORDER — POTASSIUM CHLORIDE 10 MEQ/100ML IV SOLN
10.0000 meq | Freq: Once | INTRAVENOUS | Status: AC
  Administered 2023-05-08: 10 meq via INTRAVENOUS
  Filled 2023-05-08: qty 100

## 2023-05-08 MED ORDER — ENOXAPARIN SODIUM 40 MG/0.4ML IJ SOSY
40.0000 mg | PREFILLED_SYRINGE | INTRAMUSCULAR | Status: DC
  Administered 2023-05-09 – 2023-05-10 (×2): 40 mg via SUBCUTANEOUS
  Filled 2023-05-08 (×2): qty 0.4

## 2023-05-08 MED ORDER — CHLORHEXIDINE GLUCONATE 0.12 % MT SOLN
15.0000 mL | Freq: Once | OROMUCOSAL | Status: AC
  Administered 2023-05-08: 15 mL via OROMUCOSAL

## 2023-05-08 SURGICAL SUPPLY — 73 items
APL PRP STRL LF DISP 70% ISPRP (MISCELLANEOUS) ×1
APL SKNCLS STERI-STRIP NONHPOA (GAUZE/BANDAGES/DRESSINGS)
APPLIER CLIP 5 13 M/L LIGAMAX5 (MISCELLANEOUS)
APPLIER CLIP ROT 10 11.4 M/L (STAPLE)
APR CLP MED LRG 11.4X10 (STAPLE)
APR CLP MED LRG 5 ANG JAW (MISCELLANEOUS)
BAG COUNTER SPONGE SURGICOUNT (BAG) IMPLANT
BAG SPNG CNTER NS LX DISP (BAG)
BENZOIN TINCTURE PRP APPL 2/3 (GAUZE/BANDAGES/DRESSINGS) IMPLANT
BLADE EXTENDED COATED 6.5IN (ELECTRODE) IMPLANT
BNDG ADH 1X3 SHEER STRL LF (GAUZE/BANDAGES/DRESSINGS) IMPLANT
BNDG ADH THN 3X1 STRL LF (GAUZE/BANDAGES/DRESSINGS)
CABLE HIGH FREQUENCY MONO STRZ (ELECTRODE) IMPLANT
CELLS DAT CNTRL 66122 CELL SVR (MISCELLANEOUS) IMPLANT
CHLORAPREP W/TINT 26 (MISCELLANEOUS) ×1 IMPLANT
CLIP APPLIE 5 13 M/L LIGAMAX5 (MISCELLANEOUS) IMPLANT
CLIP APPLIE ROT 10 11.4 M/L (STAPLE) IMPLANT
COVER SURGICAL LIGHT HANDLE (MISCELLANEOUS) ×1 IMPLANT
DRAIN CHANNEL 19F RND (DRAIN) IMPLANT
DRSG OPSITE POSTOP 4X10 (GAUZE/BANDAGES/DRESSINGS) IMPLANT
DRSG OPSITE POSTOP 4X6 (GAUZE/BANDAGES/DRESSINGS) IMPLANT
DRSG OPSITE POSTOP 4X8 (GAUZE/BANDAGES/DRESSINGS) IMPLANT
ELECT REM PT RETURN 15FT ADLT (MISCELLANEOUS) ×1 IMPLANT
ENDOLOOP SUT PDS II 0 18 (SUTURE) IMPLANT
GAUZE SPONGE 4X4 12PLY STRL (GAUZE/BANDAGES/DRESSINGS) IMPLANT
GLOVE BIOGEL PI IND STRL 7.0 (GLOVE) ×2 IMPLANT
GLOVE SURG SS PI 7.0 STRL IVOR (GLOVE) ×2 IMPLANT
GOWN STRL REUS W/ TWL LRG LVL3 (GOWN DISPOSABLE) ×2 IMPLANT
GOWN STRL REUS W/TWL LRG LVL3 (GOWN DISPOSABLE) ×2
IRRIG SUCT STRYKERFLOW 2 WTIP (MISCELLANEOUS) ×1
IRRIGATION SUCT STRKRFLW 2 WTP (MISCELLANEOUS) ×1 IMPLANT
KIT TURNOVER KIT A (KITS) IMPLANT
LIGASURE IMPACT 36 18CM CVD LR (INSTRUMENTS) IMPLANT
PACK COLON (CUSTOM PROCEDURE TRAY) ×1 IMPLANT
PAD POSITIONING PINK XL (MISCELLANEOUS) IMPLANT
PENCIL SMOKE EVACUATOR (MISCELLANEOUS) ×1 IMPLANT
PROTECTOR NERVE ULNAR (MISCELLANEOUS) IMPLANT
RELOAD AUTO 90-4.8 TA90 GRN (ENDOMECHANICALS) ×1 IMPLANT
RELOAD PROXIMATE 75MM BLUE (ENDOMECHANICALS) ×2 IMPLANT
RELOAD STAPLE 60 2.6 WHT THN (STAPLE) IMPLANT
RELOAD STAPLE 75 3.8 BLU REG (ENDOMECHANICALS) IMPLANT
RELOAD STAPLE 90 GRN THCK DST (ENDOMECHANICALS) IMPLANT
RELOAD STAPLER WHITE 60MM (STAPLE) IMPLANT
RETRACTOR WND ALEXIS 18 MED (MISCELLANEOUS) IMPLANT
RTRCTR WOUND ALEXIS 18CM MED (MISCELLANEOUS)
SCISSORS LAP 5X35 DISP (ENDOMECHANICALS) ×1 IMPLANT
SET TUBE SMOKE EVAC HIGH FLOW (TUBING) ×1 IMPLANT
SHEARS HARMONIC 36 ACE (MISCELLANEOUS) ×1 IMPLANT
SLEEVE Z-THREAD 5X100MM (TROCAR) ×2 IMPLANT
SPIKE FLUID TRANSFER (MISCELLANEOUS) ×1 IMPLANT
STAPLER ECHELON LONG 60 440 (INSTRUMENTS) IMPLANT
STAPLER PROXIMATE 75MM BLUE (STAPLE) IMPLANT
STAPLER RELOAD WHITE 60MM (STAPLE)
STAPLER VISISTAT 35W (STAPLE) IMPLANT
STRIP CLOSURE SKIN 1/2X4 (GAUZE/BANDAGES/DRESSINGS) IMPLANT
SUT PDS AB 0 CT1 36 (SUTURE) IMPLANT
SUT PROLENE 2 0 KS (SUTURE) IMPLANT
SUT SILK 2 0 (SUTURE) ×1
SUT SILK 2 0 SH CR/8 (SUTURE) ×1 IMPLANT
SUT SILK 2-0 18XBRD TIE 12 (SUTURE) ×1 IMPLANT
SUT SILK 3 0 (SUTURE) ×1
SUT SILK 3 0 SH CR/8 (SUTURE) ×1 IMPLANT
SUT SILK 3-0 18XBRD TIE 12 (SUTURE) ×1 IMPLANT
SUT VIC AB 2-0 SH 27 (SUTURE) ×1
SUT VIC AB 2-0 SH 27X BRD (SUTURE) ×1 IMPLANT
SUT VICRYL 0 ENDOLOOP (SUTURE) IMPLANT
SYS LAPSCP GELPORT 120MM (MISCELLANEOUS)
SYSTEM LAPSCP GELPORT 120MM (MISCELLANEOUS) IMPLANT
TAPE CLOTH 4X10 WHT NS (GAUZE/BANDAGES/DRESSINGS) IMPLANT
TOWEL OR NON WOVEN STRL DISP B (DISPOSABLE) ×1 IMPLANT
TRAY FOLEY MTR SLVR 16FR STAT (SET/KITS/TRAYS/PACK) IMPLANT
TROCAR ADV FIXATION 12X100MM (TROCAR) ×1 IMPLANT
TROCAR Z-THREAD OPTICAL 5X100M (TROCAR) ×1 IMPLANT

## 2023-05-08 NOTE — Anesthesia Procedure Notes (Signed)
Procedure Name: Intubation Date/Time: 05/08/2023 2:37 PM  Performed by: Lezlie Lye, CRNAPre-anesthesia Checklist: Patient identified, Emergency Drugs available, Suction available and Patient being monitored Patient Re-evaluated:Patient Re-evaluated prior to induction Oxygen Delivery Method: Circle System Utilized Preoxygenation: Pre-oxygenation with 100% oxygen Induction Type: IV induction, Cricoid Pressure applied and Rapid sequence Laryngoscope Size: Miller and 3 Grade View: Grade I Tube type: Oral Tube size: 7.0 mm Number of attempts: 1 Airway Equipment and Method: Stylet Placement Confirmation: ETT inserted through vocal cords under direct vision, positive ETCO2 and breath sounds checked- equal and bilateral Secured at: 21 cm Tube secured with: Tape Dental Injury: Teeth and Oropharynx as per pre-operative assessment

## 2023-05-08 NOTE — Anesthesia Preprocedure Evaluation (Addendum)
Anesthesia Evaluation  Patient identified by MRN, date of birth, ID band Patient awake    Reviewed: Allergy & Precautions, NPO status , Patient's Chart, lab work & pertinent test results  Airway Mallampati: II  TM Distance: >3 FB Neck ROM: Full    Dental  (+) Dental Advisory Given, Teeth Intact   Pulmonary pneumonia   Pulmonary exam normal breath sounds clear to auscultation       Cardiovascular negative cardio ROS Normal cardiovascular exam Rhythm:Regular Rate:Normal  Echo 02/2021  1. Left ventricular ejection fraction, by estimation, is 65 to 70%. Left ventricular ejection fraction by 3D volume is 66 %. The left ventricle has normal function. The left ventricle has no regional wall motion abnormalities. Left ventricular diastolic  parameters are indeterminate.   2. Right ventricular systolic function is normal. The right ventricular size is normal. Tricuspid regurgitation signal is inadequate for assessing PA pressure.   3. The pericardial effusion is anterior to the right ventricle. There is no evidence of cardiac tamponade.   4. The mitral valve is normal in structure. Trivial mitral valve regurgitation. No evidence of mitral stenosis.   5. The aortic valve is normal in structure. Aortic valve regurgitation is not visualized. No aortic stenosis is present.   6. The inferior vena cava is normal in size with greater than 50% respiratory variability, suggesting right atrial pressure of 3 mmHg.      Neuro/Psych negative neurological ROS     GI/Hepatic negative GI ROS, Neg liver ROS,,,  Endo/Other  negative endocrine ROS    Renal/GU negative Renal ROS     Musculoskeletal  (+) Arthritis ,    Abdominal   Peds  Hematology negative hematology ROS (+)   Anesthesia Other Findings Day of surgery medications reviewed with the patient.  Reproductive/Obstetrics                             Anesthesia  Physical Anesthesia Plan  ASA: 3 and emergent  Anesthesia Plan: General   Post-op Pain Management: Ofirmev IV (intra-op)*   Induction: Intravenous, Rapid sequence and Cricoid pressure planned  PONV Risk Score and Plan: 4 or greater and Treatment may vary due to age or medical condition, Dexamethasone, Ondansetron, Midazolam and Scopolamine patch - Pre-op  Airway Management Planned: Oral ETT  Additional Equipment:   Intra-op Plan:   Post-operative Plan: Extubation in OR  Informed Consent: I have reviewed the patients History and Physical, chart, labs and discussed the procedure including the risks, benefits and alternatives for the proposed anesthesia with the patient or authorized representative who has indicated his/her understanding and acceptance.     Dental advisory given  Plan Discussed with: CRNA  Anesthesia Plan Comments: (2 x PIV)        Anesthesia Quick Evaluation

## 2023-05-08 NOTE — ED Provider Notes (Signed)
[place] EMERGENCY DEPARTMENT AT [facility] Provider Note   CSN: [id number] Arrival date & time: 05/08/23  2440     History  Chief Complaint  Patient presents with   Abdominal Pain    Stephanie Mahoney is a 72 y.o. female with a past medical history significant for endometrial cancer, Raynaud's disease, history of vertigo, and vitamin D deficiency who presents to the ED due to persistent abdominal pain x 3 weeks.  Patient seen in the ED on 9/28 for the same complaint where a CT abdomen was obtained which showed possible colitis.  Patient discharged with Augmentin which she has been taking.  Patient states pain has gotten worse over the past few days.  No bowel movement since her ED visit on 9/28. She admits to nausea and vomiting.  States she has vomited once daily since her ED visit on 9/28 which is green in color.  Unable to tolerate po. No chest pain or shortness of breath.  No urinary symptoms.  Denies fever and chills.  Patient was instructed by her GI doctor to report to the ED for further evaluation.  Has an appointment scheduled next week for reassessment by GI.  Patient is followed by [facility].  History obtained from patient and past medical records. No interpreter used during encounter.       Home Medications Prior to Admission medications   Medication Sig Start Date End Date Taking? Authorizing Provider  acetaminophen (TYLENOL) 500 MG tablet Take 500 mg by mouth every 6 (six) hours as needed for moderate pain or headache.    [provider]  albuterol (VENTOLIN HFA) 108 (90 Base) MCG/ACT inhaler Inhale 1-2 puffs into the lungs every 6 (six) hours as needed for wheezing or shortness of breath. 11/17/22   Wallis Bamberg, PA-C  amoxicillin-clavulanate (AUGMENTIN) 875-125 MG tablet Take 1 tablet by mouth every 12 (twelve) hours. 05/04/23   Marita Kansas, PA-C  Calcium Carbonate-Vitamin D (CALCIUM-VITAMIN D3 PO) Take 1 tablet by mouth daily.    [provider]   celecoxib (CELEBREX) 100 MG capsule Take 100 mg by mouth daily. 05/09/21   [provider]  cetirizine HCl (ZYRTEC) 1 MG/ML solution Take 5 mLs (5 mg total) by mouth daily. 11/17/22   Wallis Bamberg, PA-C  Cholecalciferol 100 MCG (4000 UT) CAPS Take 4,000 Units by mouth daily.    [provider]  cyclobenzaprine (FLEXERIL) 5 MG tablet Take 5 mg by mouth 3 (three) times daily as needed for muscle spasms.     [provider]  dicyclomine (BENTYL) 20 MG tablet Take 1 tablet (20 mg total) by mouth 2 (two) times daily. 05/04/23   Marita Kansas, PA-C  estradiol (ESTRACE) 0.1 MG/GM vaginal cream Use berry sized amount nightly PV x 2 weeks then QOD. 09/22/19   [provider]  estradiol (VIVELLE-DOT) 0.1 MG/24HR patch Place 0.5 patches onto the skin 2 (two) times a week. 08/01/20   [provider]  meclizine (ANTIVERT) 25 MG tablet Take 1 tablet (25 mg total) by mouth 3 (three) times daily as needed for dizziness. 01/15/21   Jacalyn Lefevre, MD  ondansetron (ZOFRAN-ODT) 4 MG disintegrating tablet Take 1 tablet (4 mg total) by mouth every 8 (eight) hours as needed. 05/04/23   Marita Kansas, PA-C  promethazine-dextromethorphan (PROMETHAZINE-DM) 6.25-15 MG/5ML syrup Take 2.5 mLs by mouth 3 (three) times daily as needed for cough. 11/17/22   Wallis Bamberg, PA-C      Allergies    Lanolin and Naproxen  Review of Systems   Review of Systems  Constitutional:  Negative for chills and fever.  Respiratory:  Negative for shortness of breath.   Cardiovascular:  Negative for chest pain.  Gastrointestinal:  Positive for abdominal pain, constipation, nausea and vomiting. Negative for diarrhea.    Physical Exam Updated Vital Signs BP 131/76   Pulse 77   Temp 98.2 F (36.8 C) (Oral)   Resp 17   SpO2 100%  Physical Exam Vitals and nursing note reviewed.  Constitutional:      General: She is not in acute distress.    Appearance: She is not ill-appearing.  HENT:     Head:  Normocephalic.  Eyes:     Pupils: Pupils are equal, round, and reactive to light.  Cardiovascular:     Rate and Rhythm: Normal rate and regular rhythm.     Pulses: Normal pulses.     Heart sounds: Normal heart sounds. No murmur heard.    No friction rub. No gallop.  Pulmonary:     Effort: Pulmonary effort is normal.     Breath sounds: Normal breath sounds.  Abdominal:     General: Abdomen is flat. There is no distension.     Palpations: Abdomen is soft.     Tenderness: There is abdominal tenderness. There is no guarding or rebound.     Comments: Diffuse abdominal tenderness. No focal tenderness  Musculoskeletal:        General: Normal range of motion.     Cervical back: Neck supple.  Skin:    General: Skin is warm and dry.  Neurological:     General: No focal deficit present.     Mental Status: She is alert.  Psychiatric:        Mood and Affect: Mood normal.        Behavior: Behavior normal.     ED Results / Procedures / Treatments   Labs (all labs ordered are listed, but only abnormal results are displayed) Labs Reviewed  CBC WITH DIFFERENTIAL/PLATELET - Abnormal; Notable for the following components:      Result Value   WBC 11.3 (*)    Neutro Abs 9.2 (*)    All other components within normal limits  COMPREHENSIVE METABOLIC PANEL - Abnormal; Notable for the following components:   Sodium 132 (*)    Potassium 3.0 (*)    CO2 20 (*)    Calcium 8.4 (*)    Alkaline Phosphatase 37 (*)    Total Bilirubin 1.3 (*)    All other components within normal limits  URINALYSIS, ROUTINE W REFLEX MICROSCOPIC - Abnormal; Notable for the following components:   Color, Urine STRAW (*)    Specific Gravity, Urine >1.046 (*)    Hgb urine dipstick SMALL (*)    Ketones, ur 80 (*)    Leukocytes,Ua TRACE (*)    All other components within normal limits  LIPASE, BLOOD    EKG None  Radiology CT ABDOMEN PELVIS W CONTRAST  Result Date: 05/08/2023 CLINICAL DATA:  Three-week history of  worsening abdominal pain associated with nausea and vomiting EXAM: CT ABDOMEN AND PELVIS WITH CONTRAST TECHNIQUE: Multidetector CT imaging of the abdomen and pelvis was performed using the standard protocol following bolus administration of intravenous contrast. RADIATION DOSE REDUCTION: This exam was performed according to the departmental dose-optimization program which includes automated exposure control, adjustment of the mA and/or kV according to patient size and/or use of iterative reconstruction technique. CONTRAST:  75mL OMNIPAQUE IOHEXOL 300 MG/ML  SOLN  COMPARISON:  CT abdomen and pelvis dated 05/04/2023 FINDINGS: Lower chest: No focal consolidation or pulmonary nodule in the lung bases. No pleural effusion or pneumothorax demonstrated. Partially imaged heart size is normal. Hepatobiliary: No focal hepatic lesions. Slightly increased mild intrahepatic bile duct dilation. Cholelithiasis. Pancreas: No focal lesions or main ductal dilation. Spleen: Normal in size without focal abnormality. Adrenals/Urinary Tract: No adrenal nodules. No suspicious renal mass, calculi or hydronephrosis. No focal bladder wall thickening. Stomach/Bowel: Circumferential mural thickening of the gastric antrum. Marked dilation of a loop of colon in the lower midline abdomen with acute caliber transition in the midline abdomen slightly inferior to the umbilicus (2:42). This dilated loop of colon is presumed to be the cecum. Multiple additional loops of small and large bowel appear tapered at this level. Appendix is not discretely seen. Vascular/Lymphatic: No significant vascular findings are present. No enlarged abdominal or pelvic lymph nodes. Reproductive: No adnexal masses. Other: Small volume free fluid.  No free air or fluid collection. Musculoskeletal: No acute or abnormal lytic or blastic osseous lesions. Multilevel degenerative changes of the partially imaged thoracic and lumbar spine. Severe levoscoliosis of the lumbar spine.  IMPRESSION: 1. Interval increased marked dilation of a loop of colon in the lower midline abdomen with acute caliber transition in the midline abdomen slightly inferior to the umbilicus. This dilated loop of colon is presumed to be the cecum, as noted on prior CT. Multiple additional loops of small and large bowel appear tapered at this level. Findings are suspicious for cecal volvulus. Recommend consultation to general surgery. 2. Circumferential mural thickening of the gastric antrum, which may be due to underdistention or can be seen in the setting of gastritis. 3. Slightly increased mild intrahepatic bile duct dilation. Cholelithiasis. 4. Small volume free fluid in the pelvis. No free air or fluid collection. Electronically Signed   By: Agustin Cree M.D.   On: 05/08/2023 11:36    Procedures Procedures    Medications Ordered in ED Medications  potassium chloride 10 mEq in 100 mL IVPB (has no administration in time range)  cefoTEtan (CEFOTAN) 2 g in sodium chloride 0.9 % 100 mL IVPB (has no administration in time range)  0.9 %  sodium chloride infusion (has no administration in time range)  potassium chloride 10 mEq in 100 mL IVPB (has no administration in time range)  sodium chloride 0.9 % bolus 1,000 mL (1,000 mLs Intravenous New Bag/Given 05/08/23 0825)  morphine (PF) 4 MG/ML injection 4 mg (4 mg Intravenous Given 05/08/23 0825)  ondansetron (ZOFRAN) injection 4 mg (4 mg Intravenous Given 05/08/23 0825)  iohexol (OMNIPAQUE) 300 MG/ML solution 100 mL (75 mLs Intravenous Contrast Given 05/08/23 1012)    ED Course/ Medical Decision Making/ A&P Clinical Course as of 05/08/23 1303  Wed May 08, 2023  0908 WBC(!): 11.3 [CA]  0908 Potassium(!): 3.0 [CA]  0908 Sodium(!): 132 [CA]    Clinical Course User Index [CA] Mannie Stabile, PA-C                                 Medical Decision Making Amount and/or Complexity of Data Reviewed Independent Historian: spouse External Data Reviewed:  notes. Labs: ordered. Decision-making details documented in ED Course. Radiology: ordered and independent interpretation performed. Decision-making details documented in ED Course. ECG/medicine tests: ordered and independent interpretation performed. Decision-making details documented in ED Course.  Risk Prescription drug management. Decision regarding hospitalization.   This patient  presents to the ED for concern of abdominal pain, this involves an extensive number of treatment options, and is a complaint that carries with it a high risk of complications and morbidity.  The differential diagnosis includes colitis, acute abdomen, acute cystitis, etc  72 year old female presents to the ED due to abdominal pain that has been persistent for the past 3 weeks.  Seen in the ED on 9/28 where CT abdomen showed possible colitis.  Patient discharged with Augmentin which she has been compliant with.  Admits to daily emesis.  Unable to tolerate p.o.  No bowel movement since 9/28.  Upon arrival, vitals all within normal limits.  Patient in no acute distress.  Abdomen soft, nondistended with diffuse tenderness.  No rebound or guarding.  Will repeat CT abdomen due to new constipation to rule out bowel obstruction versus other etiologies of pain.  IV fluids, Zofran, morphine given.  Routine labs ordered.  CBC significant for leukocytosis 11.3.  Normal hemoglobin.  CMP significant for hypokalemia at 3.  Potassium repleted.  Normal renal function.  Normal ALT and AST.  Lipase normal.  Low suspicion for pancreatitis.  UA negative for signs of infection.  Does show ketonuria likely due to dehydration.  CT abdomen personally reviewed and interpreted which demonstrates a cecal volvulus.  Also demonstrates mural thickening of the gastric antrum, slightly increased mild intrahepatic bile duct dilation.  Discussed with general surgery for further recommendations.  12:28 PM Discussed with Percell Locus with general surgery who  will come see patient and likely admit.   Surgery to Admit (Dr. Sheliah Hatch)  Lives at home Has PCP Hx endometrial cancer       Final Clinical Impression(s) / ED Diagnoses Final diagnoses:  Cecal volvulus [facility])    Rx / DC Orders ED Discharge Orders     None         Jesusita Oka 05/08/23 1306    Bethann Berkshire, MD 05/09/23 [phone]

## 2023-05-08 NOTE — Consult Note (Incomplete)
Consult/Admission Note***  Stephanie Mahoney 1951/03/09  [id number].    Requesting MD: *** Chief Complaint/Reason for Consult: *** *** CONSIDER Location/radiation/quality/duration/severity/prior symptoms/associated symptoms HPI:  ***  ROS: ROS  Family History  Problem Relation Age of Onset   Osteoporosis Mother    BRCA 1/2 Mother    Hypertension Mother    CVA Mother    Breast cancer Mother 33   Hypertension Father    CVA Father    Cancer Sister        in leg   Scleroderma Sister    Osteoporosis Sister    Osteoporosis Sister    Hypertension Sister     Past Medical History:  Diagnosis Date   Arthritis    thumbs    BPPV (benign paroxysmal positional vertigo)    Mitral valve prolapse    Pneumonia    hx of 30 years ago    Postmenopausal bleeding    Raynaud's disease    mostly in hands    Raynaud's disease    Scoliosis    Tinnitus    Uterine cancer (HCC)    Vaginal delivery    x3    Past Surgical History:  Procedure Laterality Date   bilateral cataract surgery      DILATION AND CURETTAGE OF UTERUS     recently and 3 yrs ago   LYMPH NODE BIOPSY Bilateral 08/27/2017   Procedure: SENTINEL LYMPH NODE BIOPSY;  Surgeon: Adolphus Birchwood, MD;  Location: [facility];  Service: Gynecology;  Laterality: Bilateral;   ROBOTIC ASSISTED LAPAROSCOPIC SACROCOLPOPEXY  05/2020   Dr. Blanchard Mane   ROBOTIC ASSISTED TOTAL HYSTERECTOMY WITH BILATERAL SALPINGO OOPHERECTOMY Bilateral 08/27/2017   Procedure: XI ROBOTIC ASSISTED TOTAL HYSTERECTOMY WITH BILATERAL SALPINGO OOPHORECTOMY;  Surgeon: Adolphus Birchwood, MD;  Location: [facility];  Service: Gynecology;  Laterality: Bilateral;   SPINAL FUSION  1966    Social History:  reports that she has never smoked. She has never used smokeless tobacco. She reports current alcohol use. She reports that she does not use drugs.  Allergies:  Allergies  Allergen Reactions   Lanolin Itching   Naproxen Other (See Comments)    Other reaction(s):  constipation    (Not in a hospital admission)   Blood pressure 131/76, pulse 77, temperature 98.2 F (36.8 C), temperature source Oral, resp. rate 17, SpO2 100%. Physical Exam: *** General: pleasant, WD, *** female/female who is laying in bed in NAD*** HEENT: head is normocephalic, atraumatic.  Sclera are noninjected.  PERRL.  Ears and nose without any masses or lesions.  Mouth is pink and moist Heart: regular, rate, and rhythm.  Normal s1,s2. No obvious murmurs, gallops, or rubs noted.  Palpable radial and pedal pulses bilaterally Lungs: CTAB, no wheezes, rhonchi, or rales noted.  Respiratory effort nonlabored Abd: ***soft, NT, ND, +BS, no masses, hernias, or organomegaly MS: all 4 extremities are symmetrical with no cyanosis, clubbing, or edema. Skin: warm and dry with no masses, lesions, or rashes Neuro: Cranial nerves 2-12 grossly intact, sensation is normal throughout Psych: A&Ox3 with an appropriate affect.   Results for orders placed or performed during the hospital encounter of 05/08/23 (from the past 48 hour(s))  CBC with Differential     Status: Abnormal   Collection Time: 05/08/23  8:08 AM  Result Value Ref Range   WBC 11.3 (H) 4.0 - 10.5 K/uL   RBC 5.01 3.87 - 5.11 MIL/uL   Hemoglobin 14.3 12.0 - 15.0 g/dL   HCT 86.5 78.4 -  46.0 %   MCV 86.0 80.0 - 100.0 fL   MCH 28.5 26.0 - 34.0 pg   MCHC 33.2 30.0 - 36.0 g/dL   RDW 16.1 09.6 - 04.5 %   Platelets 299 150 - 400 K/uL   nRBC 0.0 0.0 - 0.2 %   Neutrophils Relative % 81 %   Neutro Abs 9.2 (H) 1.7 - 7.7 K/uL   Lymphocytes Relative 11 %   Lymphs Abs 1.2 0.7 - 4.0 K/uL   Monocytes Relative 7 %   Monocytes Absolute 0.8 0.1 - 1.0 K/uL   Eosinophils Relative 0 %   Eosinophils Absolute 0.0 0.0 - 0.5 K/uL   Basophils Relative 0 %   Basophils Absolute 0.0 0.0 - 0.1 K/uL   Immature Granulocytes 1 %   Abs Immature Granulocytes 0.06 0.00 - 0.07 K/uL    Comment: Performed at [facility], [address], [place], [place] [address]  Comprehensive metabolic panel     Status: Abnormal   Collection Time: 05/08/23  8:08 AM  Result Value Ref Range   Sodium 132 (L) 135 - 145 mmol/L   Potassium 3.0 (L) 3.5 - 5.1 mmol/L   Chloride 99 98 - 111 mmol/L   CO2 20 (L) 22 - 32 mmol/L   Glucose, Bld 94 70 - 99 mg/dL    Comment: Glucose reference range applies only to samples taken after fasting for at least 8 hours.   BUN 15 8 - 23 mg/dL   Creatinine, Ser 1.91 0.44 - 1.00 mg/dL   Calcium 8.4 (L) 8.9 - 10.3 mg/dL   Total Protein 7.6 6.5 - 8.1 g/dL   Albumin 4.1 3.5 - 5.0 g/dL   AST 20 15 - 41 U/L   ALT 17 0 - 44 U/L   Alkaline Phosphatase 37 (L) 38 - 126 U/L   Total Bilirubin 1.3 (H) 0.3 - 1.2 mg/dL   GFR, Estimated >47 >82 mL/min    Comment: (NOTE) Calculated using the CKD-EPI Creatinine Equation (2021)    Anion gap 13 5 - 15    Comment: Performed at [facility], [address], [place], [place] [address]  Lipase, blood     Status: None   Collection Time: 05/08/23  8:08 AM  Result Value Ref Range   Lipase 39 11 - 51 U/L    Comment: Performed at [facility], [address], [place], [place] [address]  Urinalysis, Routine w reflex microscopic -Urine, Clean Catch     Status: Abnormal   Collection Time: 05/08/23 11:23 AM  Result Value Ref Range   Color, Urine STRAW (A) YELLOW   APPearance CLEAR CLEAR   Specific Gravity, Urine >1.046 (H) 1.005 - 1.030   pH 6.0 5.0 - 8.0   Glucose, UA NEGATIVE NEGATIVE mg/dL   Hgb urine dipstick SMALL (A) NEGATIVE   Bilirubin Urine NEGATIVE NEGATIVE   Ketones, ur 80 (A) NEGATIVE mg/dL   Protein, ur NEGATIVE NEGATIVE mg/dL   Nitrite NEGATIVE NEGATIVE   Leukocytes,Ua TRACE (A) NEGATIVE   RBC / HPF 0-5 0 - 5 RBC/hpf   WBC, UA 0-5 0 - 5 WBC/hpf   Bacteria, UA NONE SEEN NONE SEEN   Squamous Epithelial / HPF 0-5 0 - 5 /HPF   Mucus PRESENT     Comment: Performed at [facility], [address],  [place], [place] [address]   CT ABDOMEN PELVIS W CONTRAST  Result Date: 05/08/2023 CLINICAL DATA:  Three-week history of worsening  abdominal pain associated with nausea and vomiting EXAM: CT ABDOMEN AND PELVIS WITH CONTRAST TECHNIQUE: Multidetector CT imaging of the abdomen and pelvis was performed using the standard protocol following bolus administration of intravenous contrast. RADIATION DOSE REDUCTION: This exam was performed according to the departmental dose-optimization program which includes automated exposure control, adjustment of the mA and/or kV according to patient size and/or use of iterative reconstruction technique. CONTRAST:  75mL OMNIPAQUE IOHEXOL 300 MG/ML  SOLN COMPARISON:  CT abdomen and pelvis dated 05/04/2023 FINDINGS: Lower chest: No focal consolidation or pulmonary nodule in the lung bases. No pleural effusion or pneumothorax demonstrated. Partially imaged heart size is normal. Hepatobiliary: No focal hepatic lesions. Slightly increased mild intrahepatic bile duct dilation. Cholelithiasis. Pancreas: No focal lesions or main ductal dilation. Spleen: Normal in size without focal abnormality. Adrenals/Urinary Tract: No adrenal nodules. No suspicious renal mass, calculi or hydronephrosis. No focal bladder wall thickening. Stomach/Bowel: Circumferential mural thickening of the gastric antrum. Marked dilation of a loop of colon in the lower midline abdomen with acute caliber transition in the midline abdomen slightly inferior to the umbilicus (2:42). This dilated loop of colon is presumed to be the cecum. Multiple additional loops of small and large bowel appear tapered at this level. Appendix is not discretely seen. Vascular/Lymphatic: No significant vascular findings are present. No enlarged abdominal or pelvic lymph nodes. Reproductive: No adnexal masses. Other: Small volume free fluid.  No free air or fluid collection. Musculoskeletal: No acute or abnormal lytic or blastic osseous lesions.  Multilevel degenerative changes of the partially imaged thoracic and lumbar spine. Severe levoscoliosis of the lumbar spine. IMPRESSION: 1. Interval increased marked dilation of a loop of colon in the lower midline abdomen with acute caliber transition in the midline abdomen slightly inferior to the umbilicus. This dilated loop of colon is presumed to be the cecum, as noted on prior CT. Multiple additional loops of small and large bowel appear tapered at this level. Findings are suspicious for cecal volvulus. Recommend consultation to general surgery. 2. Circumferential mural thickening of the gastric antrum, which may be due to underdistention or can be seen in the setting of gastritis. 3. Slightly increased mild intrahepatic bile duct dilation. Cholelithiasis. 4. Small volume free fluid in the pelvis. No free air or fluid collection. Electronically Signed   By: Agustin Cree M.D.   On: 05/08/2023 11:36      Assessment/Plan Possible cecal volvulus  - CT with concern for above - WBC 11.3   I reviewed {Reviewed data:26882::"last 24 h vitals and pain scores","last 48 h intake and output","last 24 h labs and trends","last 24 h imaging results"}.  This care required {MDM levels:26883} level of medical decision making.   Juliet Rude, [facility] 05/08/2023, 12:37 PM Please see Amion for pager number during day hours 7:00am-4:30pm

## 2023-05-08 NOTE — H&P (Addendum)
Stephanie Mahoney [birth year]  [id number].    Requesting MD: Estell Harpin, MD Chief Complaint/Reason for Consult: cecal volvulus   HPI:  Stephanie Mahoney is a 72 y/o F with a PMH vertigo, scoliosis, seasonal allergies, and endometrial cancer s/p robotic assisted TAH/BSO in 2019 who presents with worsening abdominal pain. She states that 3.5 weeks ago she developed non-specific abdominal pain that woke her up form her sleep and was associated with "gurgling". This as been relatively constant. She was seen in the ED on 9/28 where her labs were reassuring but CT of the abdomen showed fat stranding around the cecum and a small amount of free fluid in the pelvis, along with possible UTI. She was prescribed augmentin and bentyl and discharged. She followed up with per PCP 9/30 was given an urgent referral to GI for abdominal pain. She returns to the ED today with increasing abdominal pain, constipation, poor oral intake, and daily vomiting for the last 4 days. She tells me that she passed flatus yesterday but has not had a BM since her ED visit daturday 9/28 - BM described as soft, nonbloody stools. Of note the patient was seen by Dr. Magnus Ivan in our office 01/2022 after a CT scan of the abdomen 01/02/2022, done for lower abdominal pain, questioned an internal hernia. The CT showed an area of focal thickening about the transverse colon without wall thickening and the hepatic flexure appeared to be located in the pelvis and the cecum in the RUQ. She had a moderate stool burden at the time but no signs of bowel obstruction or mesenteric edema. She was referred to GI. She tells me she has been compliant with colonoscopies and never had any malignancy or diverticulitis.   The patient denies tobacco use and reports drinking wine 2 glasses weekly. She tells me that she did not require radiation or chemotherapy, her TAH/BSO was curative for her endometrial cancer. She did have a vaginal prolapse that failed non-op management  and underwent robot-assisted sacral colpopexy and intraperitoneal vaginal colporrhaphy on 05/30/2020 by Dr. Ashley Royalty. She takes vaginal estrogen cream. 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  ROS: Review of Systems  All other systems reviewed and are negative.   Family History  Problem Relation Age of Onset   Osteoporosis Mother    BRCA 1/2 Mother    Hypertension Mother    CVA Mother    Breast cancer Mother 48   Hypertension Father    CVA Father    Cancer Sister        in leg   Scleroderma Sister    Osteoporosis Sister    Osteoporosis Sister    Hypertension Sister     Past Medical History:  Diagnosis Date   Arthritis    thumbs    BPPV (benign paroxysmal positional vertigo)    Mitral valve prolapse    Pneumonia    hx of 30 years ago    Postmenopausal bleeding    Raynaud's disease    mostly in hands    Raynaud's disease    Scoliosis    Tinnitus    Uterine cancer (HCC)    Vaginal delivery    x3    Past Surgical History:  Procedure Laterality Date   bilateral cataract surgery      DILATION AND CURETTAGE OF UTERUS     recently and 3 yrs ago   LYMPH NODE BIOPSY Bilateral 08/27/2017   Procedure: SENTINEL LYMPH  NODE BIOPSY;  Surgeon: Adolphus Birchwood, MD;  Location: [facility];  Service: Gynecology;  Laterality: Bilateral;   ROBOTIC ASSISTED LAPAROSCOPIC SACROCOLPOPEXY  05/2020   Dr. Blanchard Mane   ROBOTIC ASSISTED TOTAL HYSTERECTOMY WITH BILATERAL SALPINGO OOPHERECTOMY Bilateral 08/27/2017   Procedure: XI ROBOTIC ASSISTED TOTAL HYSTERECTOMY WITH BILATERAL SALPINGO OOPHORECTOMY;  Surgeon: Adolphus Birchwood, MD;  Location: [facility];  Service: Gynecology;  Laterality: Bilateral;   SPINAL FUSION  1966    Social History:  reports that she has never smoked. She has never used smokeless tobacco. She reports current alcohol use. She reports that she does not use drugs.  Allergies:  Allergies  Allergen Reactions   Lanolin Itching   Naproxen  Other (See Comments)    Other reaction(s): constipation    (Not in a hospital admission)    Physical Exam: Blood pressure 131/76, pulse 77, temperature 98.2 F (36.8 C), temperature source Oral, resp. rate 17, SpO2 100%. General: Pleasant white female. laying on hospital bed, appears stated age, NAD. HEENT: head -normocephalic, atraumatic; Eyes: PERRLA, no conjunctival injection; anicteric sclerae Neck- Trachea is midline, no thyromegaly or JVD appreciated.  CV- RRR, normal S1/S2, no M/R/G, no lower extremity edema  Pulm- breathing is non-labored. ORA Abd- soft, lower abdominal distention and tympany, minimally tender, no rebound tenderness or guarding, no hernias or masses.  GU- deferred  MSK- UE/LE symmetrical, no cyanosis, clubbing, or edema. Neuro- CN II-XII grossly in tact, no paresthesias. Psych- Alert and Oriented x3 with appropriate affect Skin: warm and dry, no rashes or lesions   Results for orders placed or performed during the hospital encounter of 05/08/23 (from the past 48 hour(s))  CBC with Differential     Status: Abnormal   Collection Time: 05/08/23  8:08 AM  Result Value Ref Range   WBC 11.3 (H) 4.0 - 10.5 K/uL   RBC 5.01 3.87 - 5.11 MIL/uL   Hemoglobin 14.3 12.0 - 15.0 g/dL   HCT 91.4 78.2 - 95.6 %   MCV 86.0 80.0 - 100.0 fL   MCH 28.5 26.0 - 34.0 pg   MCHC 33.2 30.0 - 36.0 g/dL   RDW 21.3 08.6 - 57.8 %   Platelets 299 150 - 400 K/uL   nRBC 0.0 0.0 - 0.2 %   Neutrophils Relative % 81 %   Neutro Abs 9.2 (H) 1.7 - 7.7 K/uL   Lymphocytes Relative 11 %   Lymphs Abs 1.2 0.7 - 4.0 K/uL   Monocytes Relative 7 %   Monocytes Absolute 0.8 0.1 - 1.0 K/uL   Eosinophils Relative 0 %   Eosinophils Absolute 0.0 0.0 - 0.5 K/uL   Basophils Relative 0 %   Basophils Absolute 0.0 0.0 - 0.1 K/uL   Immature Granulocytes 1 %   Abs Immature Granulocytes 0.06 0.00 - 0.07 K/uL    Comment: Performed at [facility], [address] [place], [place]  [address]  Comprehensive metabolic panel     Status: Abnormal   Collection Time: 05/08/23  8:08 AM  Result Value Ref Range   Sodium 132 (L) 135 - 145 mmol/L   Potassium 3.0 (L) 3.5 - 5.1 mmol/L   Chloride 99 98 - 111 mmol/L   CO2 20 (L) 22 - 32 mmol/L   Glucose, Bld 94 70 - 99 mg/dL    Comment: Glucose reference range applies only to samples taken after fasting for at least 8 hours.   BUN 15 8 - 23 mg/dL   Creatinine, Ser 9.52 0.44 - 1.00  mg/dL   Calcium 8.4 (L) 8.9 - 10.3 mg/dL   Total Protein 7.6 6.5 - 8.1 g/dL   Albumin 4.1 3.5 - 5.0 g/dL   AST 20 15 - 41 U/L   ALT 17 0 - 44 U/L   Alkaline Phosphatase 37 (L) 38 - 126 U/L   Total Bilirubin 1.3 (H) 0.3 - 1.2 mg/dL   GFR, Estimated >16 >10 mL/min    Comment: (NOTE) Calculated using the CKD-EPI Creatinine Equation (2021)    Anion gap 13 5 - 15    Comment: Performed at [facility], [address] [place], [place] [address]  Lipase, blood     Status: None   Collection Time: 05/08/23  8:08 AM  Result Value Ref Range   Lipase 39 11 - 51 U/L    Comment: Performed at [facility], [address], [place], [place] [address]  Urinalysis, Routine w reflex microscopic -Urine, Clean Catch     Status: Abnormal   Collection Time: 05/08/23 11:23 AM  Result Value Ref Range   Color, Urine STRAW (A) YELLOW   APPearance CLEAR CLEAR   Specific Gravity, Urine >1.046 (H) 1.005 - 1.030   pH 6.0 5.0 - 8.0   Glucose, UA NEGATIVE NEGATIVE mg/dL   Hgb urine dipstick SMALL (A) NEGATIVE   Bilirubin Urine NEGATIVE NEGATIVE   Ketones, ur 80 (A) NEGATIVE mg/dL   Protein, ur NEGATIVE NEGATIVE mg/dL   Nitrite NEGATIVE NEGATIVE   Leukocytes,Ua TRACE (A) NEGATIVE   RBC / HPF 0-5 0 - 5 RBC/hpf   WBC, UA 0-5 0 - 5 WBC/hpf   Bacteria, UA NONE SEEN NONE SEEN   Squamous Epithelial / HPF 0-5 0 - 5 /HPF   Mucus PRESENT     Comment: Performed at [facility], [address], [place], [place] [address]    CT ABDOMEN PELVIS W CONTRAST  Result Date: 05/08/2023 CLINICAL DATA:  Three-week history of worsening abdominal pain associated with nausea and vomiting EXAM: CT ABDOMEN AND PELVIS WITH CONTRAST TECHNIQUE: Multidetector CT imaging of the abdomen and pelvis was performed using the standard protocol following bolus administration of intravenous contrast. RADIATION DOSE REDUCTION: This exam was performed according to the departmental dose-optimization program which includes automated exposure control, adjustment of the mA and/or kV according to patient size and/or use of iterative reconstruction technique. CONTRAST:  75mL OMNIPAQUE IOHEXOL 300 MG/ML  SOLN COMPARISON:  CT abdomen and pelvis dated 05/04/2023 FINDINGS: Lower chest: No focal consolidation or pulmonary nodule in the lung bases. No pleural effusion or pneumothorax demonstrated. Partially imaged heart size is normal. Hepatobiliary: No focal hepatic lesions. Slightly increased mild intrahepatic bile duct dilation. Cholelithiasis. Pancreas: No focal lesions or main ductal dilation. Spleen: Normal in size without focal abnormality. Adrenals/Urinary Tract: No adrenal nodules. No suspicious renal mass, calculi or hydronephrosis. No focal bladder wall thickening. Stomach/Bowel: Circumferential mural thickening of the gastric antrum. Marked dilation of a loop of colon in the lower midline abdomen with acute caliber transition in the midline abdomen slightly inferior to the umbilicus (2:42). This dilated loop of colon is presumed to be the cecum. Multiple additional loops of small and large bowel appear tapered at this level. Appendix is not discretely seen. Vascular/Lymphatic: No significant vascular findings are present. No enlarged abdominal or pelvic lymph nodes. Reproductive: No adnexal masses. Other: Small volume free fluid.  No free air or fluid collection. Musculoskeletal: No acute or abnormal lytic or blastic osseous lesions. Multilevel degenerative  changes  of the partially imaged thoracic and lumbar spine. Severe levoscoliosis of the lumbar spine. IMPRESSION: 1. Interval increased marked dilation of a loop of colon in the lower midline abdomen with acute caliber transition in the midline abdomen slightly inferior to the umbilicus. This dilated loop of colon is presumed to be the cecum, as noted on prior CT. Multiple additional loops of small and large bowel appear tapered at this level. Findings are suspicious for cecal volvulus. Recommend consultation to general surgery. 2. Circumferential mural thickening of the gastric antrum, which may be due to underdistention or can be seen in the setting of gastritis. 3. Slightly increased mild intrahepatic bile duct dilation. Cholelithiasis. 4. Small volume free fluid in the pelvis. No free air or fluid collection. Electronically Signed   By: Agustin Cree M.D.   On: 05/08/2023 11:36      Assessment/Plan LBO, possible cecal volvulus  72 y/o F with 3-4 weeks of abdominal discomfort who presents now with an acute increase in her pain, constipation, bloating, and vomiting. She is afebrile and hemodynamically stable. CT scan concerning for possible cecal volvulus. She has no signs of bowel ischemia or perforation at present. Her labs, imaging, history, have all been reviewed by myself and Dr. Sheliah Hatch. I recommend proceeding to the operative room for diagnostic laparoscopy and possible partial colectomy.   The operative and non-operative management of cecal volvulus was discussed with the patient. Risks of surgery including bleeding, infection, damage to surrounding structures, conversion to open, need for additional procedures, prolonged hospital stay, as well as the risks of general anesthesia were discussed with the patient and she would like to proceed with surgery. Questions were welcomed and answered. Her husband, Kit, was on the phone during our discussion as well.   Plan to admit to the [facility] service  post-operatively.   I reviewed nursing notes, ED provider notes, last 24 h vitals and pain scores, last 48 h intake and output, last 24 h labs and trends, and last 24 h imaging results.  Adam Phenix, PA-C [facility] 05/08/2023, 1:01 PM Please see Amion for pager number during day hours 7:00am-4:30pm or 7:00am -11:30am on weekends

## 2023-05-08 NOTE — Transfer of Care (Signed)
Immediate Anesthesia Transfer of Care Note  Patient: NAZYIA GAUGH  Procedure(s) Performed: PARTIAL COLECTOMY WITH ILEOCOLIC ANASTOMIS  Patient Location: PACU  Anesthesia Type:General  Level of Consciousness: awake, alert , oriented, and patient cooperative  Airway & Oxygen Therapy: Patient Spontanous Breathing and Patient connected to face mask oxygen  Post-op Assessment: Report given to RN and Post -op Vital signs reviewed and stable  Post vital signs: Reviewed and stable  Last Vitals:  Vitals Value Taken Time  BP 134/71 05/08/23 1555  Temp    Pulse 70 05/08/23 1556  Resp 25 05/08/23 1556  SpO2 100 % 05/08/23 1556  Vitals shown include unfiled device data.  Last Pain:  Vitals:   05/08/23 1358  TempSrc:   PainSc: 2          Complications: No notable events documented.

## 2023-05-08 NOTE — Op Note (Signed)
Preoperative diagnosis: cecal volvulus  Postoperative diagnosis: same   Procedure: right colon resection with ileo-colic anastomosis  Surgeon: Feliciana Rossetti, M.D.  Asst: Hosie Spangle, [facility]  Anesthesia: GETA  Indications for procedure: Stephanie Mahoney is a 72 y.o. year old female with symptoms of abdominal pain and vomiting for 3 days. Work up was concerning for cecal volvulus. After discussion she proceeded to surgery.  Description of procedure: The patient was brought into the operative suite. Anesthesia was administered with General endotracheal anesthesia. WHO checklist was applied. The patient was then placed in supine position. The area was prepped and draped in the usual sterile fashion.  A lower midline incision was made.  Cautery was used dissect down through subcutaneous tissues and the fascia was entered in the midline.  Cecum was immediately apparent and very enlarged.  Manual palpation allowed it to be delivered through the incision.  It was volvulized around its mesentery pedicle multiple strands of scar tissue at this point.  Decision was made to proceed with resection of the area.  Mesentery was divided and the terminal ileum and the transverse colon.  The colon was identified and ligated with 3-0 silk stick tie and divided with LigaSure.  The terminal ileum was divided with a 75 mm blue load GIA stapler.  The transverse colon was divided with a blue load GIA 75 mm stapler.  There was bleeding along the staple line that was ligated with 3-0 silk.  Specimen was sent.  Side-to-side anastomosis was made with 75 mm GIA stapler.  The enterotomy was closed with a 90 mm TA stapler.  3-0 silk was used for antiobstruction stitch and 2 imbricate the edges of the staple line.  Mesentery defect was quite large so it was not closed.  Stenosis widely patent.  Anastomosis placed back into the abdomen.  The abdomen was irrigated.  No purulence or fecal continence were seen.  Colorectal changeover  was performed.  Fascia was closed with 0 PDS in running fashion.  Skin was closed with 4-0 Monocryl in running subcuticular fashion.  Management placed dressing.  Patient woke from anesthesia brought to PACU stable condition.  All counts were correct.  Findings: Cecal volvulus  Specimen: Right colon  Implant: None  Blood loss: 50 mL  Local anesthesia: None  Complications: None  Feliciana Rossetti, M.D. General, Bariatric, & Minimally Invasive Surgery [facility]

## 2023-05-08 NOTE — ED Triage Notes (Signed)
Abdominal pain x3 weeks that has been becoming progressively. Patient reports N/V. Patient reports that they haven't eaten since Monday. No bowel movement since Saturday. Denies fevers, but reports sweating. PCP suggested pt comes in to see On call GI doctor bc appointment for a specialist is not until the 9th.

## 2023-05-08 NOTE — Anesthesia Postprocedure Evaluation (Signed)
Anesthesia Post Note  Patient: Stephanie Mahoney  Procedure(s) Performed: PARTIAL COLECTOMY WITH ILEOCOLIC ANASTOMIS     Patient location during evaluation: PACU Anesthesia Type: General Level of consciousness: sedated and patient cooperative Pain management: pain level controlled Vital Signs Assessment: post-procedure vital signs reviewed and stable Respiratory status: spontaneous breathing Cardiovascular status: stable Anesthetic complications: no   No notable events documented.  Last Vitals:  Vitals:   05/08/23 1819 05/08/23 1923  BP: 120/64 122/65  Pulse: 77 86  Resp: 16 18  Temp: (!) 36.4 C 36.9 C  SpO2: 97% 96%    Last Pain:  Vitals:   05/08/23 1923  TempSrc: Oral  PainSc:                  [name]

## 2023-05-09 ENCOUNTER — Encounter (HOSPITAL_COMMUNITY): Payer: Self-pay | Admitting: General Surgery

## 2023-05-09 LAB — CBC
HCT: 38.4 % (ref 36.0–46.0)
Hemoglobin: 12.9 g/dL (ref 12.0–15.0)
MCH: 29.4 pg (ref 26.0–34.0)
MCHC: 33.6 g/dL (ref 30.0–36.0)
MCV: 87.5 fL (ref 80.0–100.0)
Platelets: 283 10*3/uL (ref 150–400)
RBC: 4.39 MIL/uL (ref 3.87–5.11)
RDW: 13.4 % (ref 11.5–15.5)
WBC: 16.8 10*3/uL — ABNORMAL HIGH (ref 4.0–10.5)
nRBC: 0 % (ref 0.0–0.2)

## 2023-05-09 LAB — BASIC METABOLIC PANEL
Anion gap: 17 — ABNORMAL HIGH (ref 5–15)
BUN: 9 mg/dL (ref 8–23)
CO2: 16 mmol/L — ABNORMAL LOW (ref 22–32)
Calcium: 8 mg/dL — ABNORMAL LOW (ref 8.9–10.3)
Chloride: 102 mmol/L (ref 98–111)
Creatinine, Ser: 0.54 mg/dL (ref 0.44–1.00)
GFR, Estimated: >60 mL/min (ref >60)
Glucose, Bld: 113 mg/dL — ABNORMAL HIGH (ref 70–99)
Potassium: 3.3 mmol/L — ABNORMAL LOW (ref 3.5–5.1)
Sodium: 135 mmol/L (ref 135–145)

## 2023-05-09 MED ORDER — MECLIZINE HCL 25 MG PO TABS: 25.0000 mg | ORAL_TABLET | Freq: Three times a day (TID) | ORAL | Status: DC | PRN

## 2023-05-09 MED ORDER — IBUPROFEN 200 MG PO TABS
600.0000 mg | ORAL_TABLET | Freq: Four times a day (QID) | ORAL | Status: DC | PRN
  Administered 2023-05-10 (×2): 600 mg via ORAL
  Filled 2023-05-09 (×2): qty 3

## 2023-05-09 MED ORDER — ALBUTEROL SULFATE (2.5 MG/3ML) 0.083% IN NEBU: 3.0000 mL | INHALATION_SOLUTION | Freq: Four times a day (QID) | RESPIRATORY_TRACT | Status: DC | PRN

## 2023-05-09 MED ORDER — CALCIUM CARBONATE ANTACID 500 MG PO CHEW: 1.0000 | CHEWABLE_TABLET | ORAL | Status: DC | PRN

## 2023-05-09 NOTE — TOC Initial Note (Signed)
Transition of Care [facility]) - Initial/Assessment Note    Patient Details  Name: Stephanie Mahoney MRN: [id number] Date of Birth: 12-16-1950  Transition of Care [facility]) CM/SW Contact:    Adrian Prows, RN Phone Number: 05/09/2023, 3:21 PM  Clinical Narrative:                 TOC for d/c planning; spoke w/ pt in room;pt says she is from home; she plans to return at d/c; she identified POC Stephanie Mahoney (spouse) [phone]; she denies SDOH risks; pt says she has transportation; she verifies insurance and pcp; pt says she has a left HA, and reading glasses; she does not have DME, HH services, or home oxygen; eval by PT; no follow-up needed; TOC will sign off; please place consult if needed.  Expected Discharge Plan: Home/Self Care Barriers to Discharge: Continued Medical Work up   Patient Goals and CMS Choice Patient states their goals for this hospitalization and ongoing recovery are:: home          Expected Discharge Plan and Services   Discharge Planning Services: CM Consult   Living arrangements for the past 2 months: Single Family Home                                      Prior Living Arrangements/Services Living arrangements for the past 2 months: Single Family Home Lives with:: Spouse Patient language and need for interpreter reviewed:: Yes Do you feel safe going back to the place where you live?: Yes      Need for Family Participation in Patient Care: Yes (Comment) Care giver support system in place?: Yes (comment) Current home services:  (n/a) Criminal Activity/Legal Involvement Pertinent to Current Situation/Hospitalization: No - Comment as needed  Activities of Daily Living   ADL Screening (condition at time of admission) Independently performs ADLs?: Yes (appropriate for developmental age) Is the patient deaf or have difficulty hearing?: No Does the patient have difficulty seeing, even when wearing glasses/contacts?: No Does the patient have  difficulty concentrating, remembering, or making decisions?: No  Permission Sought/Granted Permission sought to share information with : Case Manager Permission granted to share information with : Yes, Verbal Permission Granted  Share Information with NAME: Case Manager     Permission granted to share info w Relationship: Stephanie Mahoney (spouse) [phone]     Emotional Assessment Appearance:: Appears stated age Attitude/Demeanor/Rapport: Gracious Affect (typically observed): Accepting Orientation: : Oriented to Self, Oriented to Place, Oriented to  Time, Oriented to Situation Alcohol / Substance Use: Not Applicable Psych Involvement: No (comment)  Admission diagnosis:  Cecal volvulus (HCC) [K56.2] Patient Active Problem List   Diagnosis Date Noted   Cecal volvulus (HCC) 05/08/2023   Senile osteoporosis 12/07/2021   Endometrial cancer (HCC) 08/27/2017   Scoliosis 07/17/2017   Vitamin D deficiency 07/17/2017   Endometrial ca (HCC) 07/17/2017   PCP:  Rodrigo Ran, MD Pharmacy:   [id number] - [place], [place] - [address] [place] [address] Phone: [phone] Fax: [phone]     Social Determinants of Health (SDOH) Social History: SDOH Screenings   Food Insecurity: No Food Insecurity (05/09/2023)  Housing: Patient Declined (05/09/2023)  Transportation Needs: No Transportation Needs (05/09/2023)  Utilities: Not At Risk (05/08/2023)  Tobacco Use: Low Risk  (05/08/2023)   SDOH Interventions: Food Insecurity Interventions: Intervention Not Indicated, Inpatient TOC Housing Interventions: Intervention Not Indicated, Inpatient [facility] Transportation  Interventions: Intervention Not Indicated, Inpatient TOC Utilities Interventions: Intervention Not Indicated, Inpatient TOC   Readmission Risk Interventions     No data to display

## 2023-05-09 NOTE — Evaluation (Signed)
Physical Therapy Evaluation Patient Details Name: Stephanie Mahoney MRN: [id number] DOB: June 23, 1951 Today's Date: 05/09/2023  History of Present Illness  71yo F who presented  to the ED on 05/08/23 with non-specific abdominal pain, constipation, poor oral intake, daily vomiting, no BMs since 05/04/23. Received partial colectomy with ileocolic anastomis 05/08/23.  PMH BPPV, mitral valve prolapse, raynauds disease, scoliosis, uterine CA, spinal fusion  Clinical Impression   Pt received in recliner, pleasant and cooperative with skilled PT services today. Mobilizing really well with IV pole in the hallway and able to progress gait distance this morning from what she walked with nursing last night. Main barrier for return home from PT side is steps- will keep her on caseload and work towards this goal during this admission, anticipate she will do well. Left in bed with all needs met. Do not anticipate need for skilled PT f/u after DC.         If plan is discharge home, recommend the following: A little help with bathing/dressing/bathroom;Help with stairs or ramp for entrance;Assist for transportation;Assistance with cooking/housework   Can travel by private vehicle        Equipment Recommendations None recommended by PT  Recommendations for Other Services       Functional Status Assessment Patient has had a recent decline in their functional status and demonstrates the ability to make significant improvements in function in a reasonable and predictable amount of time.     Precautions / Restrictions Precautions Precautions: Other (comment) Precaution Comments: s/p abdominal surgery Restrictions Weight Bearing Restrictions: No      Mobility  Bed Mobility Overal bed mobility: Modified Independent             General bed mobility comments: use of rails/increased time for sit to supine, cues for log rolling to protect abdominal incisions    Transfers Overall transfer level: Needs  assistance Equipment used: None Transfers: Sit to/from Stand Sit to Stand: Modified independent (Device/Increase time)           General transfer comment: no physical assist needed or cues given    Ambulation/Gait Ambulation/Gait assistance: Supervision Gait Distance (Feet): 400 Feet Assistive device: IV Pole Gait Pattern/deviations: WFL(Within Functional Limits)       General Gait Details: WNL with mild L/R drift  Stairs            Wheelchair Mobility     Tilt Bed    Modified Rankin (Stroke Patients Only)       Balance Overall balance assessment: Mild deficits observed, not formally tested                                           Pertinent Vitals/Pain Pain Assessment Pain Assessment: 0-10 Pain Score: 2  Pain Location: abdominal pain Pain Descriptors / Indicators: Discomfort Pain Intervention(s): Limited activity within patient's tolerance, Monitored during session    Home Living Family/patient expects to be discharged to:: Private residence Living Arrangements: Spouse/significant other Available Help at Discharge: Family;Available PRN/intermittently Type of Home: House Home Access: Other (comment);Level entry (split level with rails on steps going up and down)       Home Layout: Other (Comment) (split level home) Home Equipment: None      Prior Function Prior Level of Function : Independent/Modified Independent             Mobility Comments: was working from home part  time- remote work on [employer], walks a lot with dogs and friends, gardens, reads       Extremity/Trunk Assessment   Upper Extremity Assessment Upper Extremity Assessment: Overall WFL for tasks assessed    Lower Extremity Assessment Lower Extremity Assessment: Overall WFL for tasks assessed    Cervical / Trunk Assessment Cervical / Trunk Assessment: Normal  Communication   Communication Communication: No apparent difficulties  Cognition  Arousal: Alert Behavior During Therapy: WFL for tasks assessed/performed Overall Cognitive Status: Within Functional Limits for tasks assessed                                          General Comments      Exercises     Assessment/Plan    PT Assessment Patient needs continued PT services  PT Problem List Decreased mobility;Pain;Decreased activity tolerance       PT Treatment Interventions DME instruction;Gait training;Patient/family education;Stair training;Functional mobility training;Manual techniques;Therapeutic activities;Therapeutic exercise;Balance training;Neuromuscular re-education    PT Goals (Current goals can be found in the Care Plan section)  Acute Rehab PT Goals Patient Stated Goal: go home when medically ready PT Goal Formulation: With patient Time For Goal Achievement: 05/23/23 Potential to Achieve Goals: Good    Frequency Min 1X/week     Co-evaluation               AM-PAC PT "6 Clicks" Mobility  Outcome Measure Help needed turning from your back to your side while in a flat bed without using bedrails?: A Little Help needed moving from lying on your back to sitting on the side of a flat bed without using bedrails?: A Little Help needed moving to and from a bed to a chair (including a wheelchair)?: None Help needed standing up from a chair using your arms (e.g., wheelchair or bedside chair)?: None Help needed to walk in hospital room?: None Help needed climbing 3-5 steps with a railing? : A Little 6 Click Score: 21    End of Session   Activity Tolerance: Patient tolerated treatment well Patient left: in bed;with call bell/phone within reach Nurse Communication: Mobility status PT Visit Diagnosis: Difficulty in walking, not elsewhere classified (R26.2)    Time: 1005-1026 PT Time Calculation (min) (ACUTE ONLY): 21 min   Charges:   PT Evaluation $PT Eval Moderate Complexity: 1 Mod   PT General Charges $$ ACUTE PT VISIT: 1  Visit        Nedra Hai, PT, DPT 05/09/23 10:45 AM

## 2023-05-09 NOTE — Plan of Care (Signed)
  Problem: Education: Goal: Knowledge of General Education information will improve Description: Including pain rating scale, medication(s)/side effects and non-pharmacologic comfort measures Outcome: Progressing   Problem: Clinical Measurements: Goal: Ability to maintain clinical measurements within normal limits will improve Outcome: Progressing   

## 2023-05-09 NOTE — Progress Notes (Signed)
[facility] Progress Note  1 Day Post-Op  Subjective: CC:  Reports minimal pain, some incisional pain while getting OOB. She denies nausea or belching. Denies flatus. Says she has walked the hall x2. She just voided this morning s/p foley removal.   Objective: Vital signs in last 24 hours: Temp:  [97.4 F (36.3 C)-98.7 F (37.1 C)] 98.4 F (36.9 C) (10/03 0854) Pulse Rate:  [60-89] 78 (10/03 0854) Resp:  [12-20] 16 (10/03 0854) BP: (111-146)/(60-76) 118/60 (10/03 0854) SpO2:  [95 %-100 %] 100 % (10/03 0854) Weight:  [59.1 kg] 59.1 kg (10/02 1358) Last BM Date : 04/06/23  Intake/Output from previous day: 10/02 0701 - 10/03 0700 In: 2220 [P.O.:120; I.V.:2000; IV Piggyback:100] Out: 600 [Urine:550; Blood:50] Intake/Output this shift: Total I/O In: 360 [P.O.:360] Out: 400 [Urine:400]  PE: Gen:  Alert, NAD, pleasant Card:  Regular rate and rhythm Pulm:  Normal effort ORA Abd: Soft, appropriately tender, midline incision with honeycomb dressing in place c/d/I  Skin: warm and dry, no rashes  Psych: A&Ox3   Lab Results:  Recent Labs    05/08/23 1819 05/09/23 0447  WBC 10.4 16.8*  HGB 14.0 12.9  HCT 44.1 38.4  PLT 237 283   BMET Recent Labs    05/08/23 0808 05/08/23 1819 05/09/23 0447  NA 132*  --  135  K 3.0*  --  3.3*  CL 99  --  102  CO2 20*  --  16*  GLUCOSE 94  --  113*  BUN 15  --  9  CREATININE 0.71 0.60 0.54  CALCIUM 8.4*  --  8.0*   PT/INR No results for input(s): "LABPROT", "INR" in the last 72 hours. CMP     Component Value Date/Time   NA 135 05/09/2023 0447   K 3.3 (L) 05/09/2023 0447   CL 102 05/09/2023 0447   CO2 16 (L) 05/09/2023 0447   GLUCOSE 113 (H) 05/09/2023 0447   BUN 9 05/09/2023 0447   CREATININE 0.54 05/09/2023 0447   CALCIUM 8.0 (L) 05/09/2023 0447   PROT 7.6 05/08/2023 0808   ALBUMIN 4.1 05/08/2023 0808   AST 20 05/08/2023 0808   ALT 17 05/08/2023 0808   ALKPHOS 37 (L) 05/08/2023 0808   BILITOT 1.3 (H)  05/08/2023 0808   GFRNONAA >60 05/09/2023 0447   GFRAA >60 02/24/2018 1229   Lipase     Component Value Date/Time   LIPASE 39 05/08/2023 0808       Studies/Results: CT ABDOMEN PELVIS W CONTRAST  Result Date: 05/08/2023 CLINICAL DATA:  Three-week history of worsening abdominal pain associated with nausea and vomiting EXAM: CT ABDOMEN AND PELVIS WITH CONTRAST TECHNIQUE: Multidetector CT imaging of the abdomen and pelvis was performed using the standard protocol following bolus administration of intravenous contrast. RADIATION DOSE REDUCTION: This exam was performed according to the departmental dose-optimization program which includes automated exposure control, adjustment of the mA and/or kV according to patient size and/or use of iterative reconstruction technique. CONTRAST:  75mL OMNIPAQUE IOHEXOL 300 MG/ML  SOLN COMPARISON:  CT abdomen and pelvis dated 05/04/2023 FINDINGS: Lower chest: No focal consolidation or pulmonary nodule in the lung bases. No pleural effusion or pneumothorax demonstrated. Partially imaged heart size is normal. Hepatobiliary: No focal hepatic lesions. Slightly increased mild intrahepatic bile duct dilation. Cholelithiasis. Pancreas: No focal lesions or main ductal dilation. Spleen: Normal in size without focal abnormality. Adrenals/Urinary Tract: No adrenal nodules. No suspicious renal mass, calculi or hydronephrosis. No focal bladder wall thickening. Stomach/Bowel: Circumferential mural  thickening of the gastric antrum. Marked dilation of a loop of colon in the lower midline abdomen with acute caliber transition in the midline abdomen slightly inferior to the umbilicus (2:42). This dilated loop of colon is presumed to be the cecum. Multiple additional loops of small and large bowel appear tapered at this level. Appendix is not discretely seen. Vascular/Lymphatic: No significant vascular findings are present. No enlarged abdominal or pelvic lymph nodes. Reproductive: No  adnexal masses. Other: Small volume free fluid.  No free air or fluid collection. Musculoskeletal: No acute or abnormal lytic or blastic osseous lesions. Multilevel degenerative changes of the partially imaged thoracic and lumbar spine. Severe levoscoliosis of the lumbar spine. IMPRESSION: 1. Interval increased marked dilation of a loop of colon in the lower midline abdomen with acute caliber transition in the midline abdomen slightly inferior to the umbilicus. This dilated loop of colon is presumed to be the cecum, as noted on prior CT. Multiple additional loops of small and large bowel appear tapered at this level. Findings are suspicious for cecal volvulus. Recommend consultation to general surgery. 2. Circumferential mural thickening of the gastric antrum, which may be due to underdistention or can be seen in the setting of gastritis. 3. Slightly increased mild intrahepatic bile duct dilation. Cholelithiasis. 4. Small volume free fluid in the pelvis. No free air or fluid collection. Electronically Signed   By: Agustin Cree M.D.   On: 05/08/2023 11:36    Anti-infectives: Anti-infectives (From admission, onward)    Start     Dose/Rate Route Frequency Ordered Stop   05/08/23 1315  cefoTEtan (CEFOTAN) 2 g in sodium chloride 0.9 % 100 mL IVPB       Note to Pharmacy: Pharmacy may adjust dose strength for optimal dosing.   Send with patient on call to the OR.  Anesthesia to complete antibiotic administration <57min prior to incision per [facility].   2 g 200 mL/hr over 30 Minutes Intravenous On call to O.R. 05/08/23 1259 05/08/23 1518        Assessment/Plan  Cecal volvulus  POD#1 s/p open right colectomy with ileocolic anastomosis 10/2 Dr. Sheliah Hatch - AFVSS, WBC 10, hgb stable - CLD, await bowel function - OOB/mobilize  FEN: CLD, IVF @ 50 mL/hr ID: perioperative cefotan x 1 dose Foley: removed POD#1, spont voids DVT ppx: Lovenox, SCD's Dispo: med-surg, await bowel function      LOS: 1 day    I reviewed nursing notes, last 24 h vitals and pain scores, last 48 h intake and output, last 24 h labs and trends, and last 24 h imaging results.    Hosie Spangle, PA-C [facility] Please see Amion for pager number during day hours 7:00am-4:30pm

## 2023-05-10 ENCOUNTER — Telehealth: Payer: Self-pay | Admitting: Pharmacy Technician

## 2023-05-10 NOTE — Progress Notes (Signed)
Physical Therapy Treatment Patient Details Name: Stephanie Mahoney MRN: [id number] DOB: October 03, 1950 Today's Date: 05/10/2023   History of Present Illness 71yo F who presented  to the ED on 05/08/23 with non-specific abdominal pain, constipation, poor oral intake, daily vomiting, no BMs since 05/04/23. Received partial colectomy with ileocolic anastomis 05/08/23.  PMH BPPV, mitral valve prolapse, raynauds disease, scoliosis, uterine CA, spinal fusion    PT Comments   Patient evaluated by Physical Therapy on 05/09/2023 and treatment 05/10/2023 with no further acute PT needs identified. All education has been completed and the patient has no further questions. Pt is able to perform bed mobility, transfers, gait no AD 500 feet and step navigation with L handrail only to emulate home setting with reciprocal pattern mod I. See below for any follow-up Physical Therapy or DME needs. PT is signing off. Thank you for this referral.     If plan is discharge home, recommend the following: Help with stairs or ramp for entrance;Assist for transportation;Assistance with cooking/housework   Can travel by private vehicle        Equipment Recommendations  None recommended by PT    Recommendations for Other Services       Precautions / Restrictions Precautions Precautions: Other (comment) Precaution Comments: s/p abdominal surgery Restrictions Weight Bearing Restrictions: No     Mobility  Bed Mobility Overal bed mobility: Modified Independent             General bed mobility comments: use of rails/increased time for sit to supine, cues for log rolling to protect abdominal incisions    Transfers Overall transfer level: Modified independent Equipment used: None Transfers: Sit to/from Stand Sit to Stand: Modified independent (Device/Increase time)           General transfer comment: pt demonstrated MI for bed and commode transfers    Ambulation/Gait Ambulation/Gait assistance: Modified  independent (Device/Increase time) Gait Distance (Feet): 500 Feet Assistive device: None Gait Pattern/deviations: WFL(Within Functional Limits)       General Gait Details: WNL   Stairs Stairs: Yes Stairs assistance: Modified independent (Device/Increase time) Stair Management: One rail Left Number of Stairs: 7 General stair comments: pt demonstrated good reciprocal pattern with step navigation with L handrail   Wheelchair Mobility     Tilt Bed    Modified Rankin (Stroke Patients Only)       Balance Overall balance assessment: No apparent balance deficits (not formally assessed)                                          Cognition Arousal: Alert Behavior During Therapy: WFL for tasks assessed/performed Overall Cognitive Status: Within Functional Limits for tasks assessed                                          Exercises      General Comments        Pertinent Vitals/Pain Pain Assessment Pain Assessment: 0-10 Pain Score: 3  Pain Location: abdominal pain Pain Descriptors / Indicators: Discomfort Pain Intervention(s): Limited activity within patient's tolerance, Monitored during session    Home Living Family/patient expects to be discharged to:: Private residence Living Arrangements: Spouse/significant other Available Help at Discharge: Family;Available PRN/intermittently Type of Home: House Home Access: Other (comment);Level entry (split level with rails on steps  going up and down)       Home Layout: Other (Comment) (split level home) Home Equipment: None      Prior Function            PT Goals (current goals can now be found in the care plan section) Acute Rehab PT Goals Patient Stated Goal: go home when medically ready PT Goal Formulation: With patient Time For Goal Achievement: 05/23/23 Potential to Achieve Goals: Good    Frequency    Min 1X/week      PT Plan      Co-evaluation               AM-PAC PT "6 Clicks" Mobility   Outcome Measure  Help needed turning from your back to your side while in a flat bed without using bedrails?: None Help needed moving from lying on your back to sitting on the side of a flat bed without using bedrails?: None Help needed moving to and from a bed to a chair (including a wheelchair)?: None Help needed standing up from a chair using your arms (e.g., wheelchair or bedside chair)?: None Help needed to walk in hospital room?: None Help needed climbing 3-5 steps with a railing? : None 6 Click Score: 24    End of Session Equipment Utilized During Treatment: Gait belt Activity Tolerance: Patient tolerated treatment well Patient left: in bed;with call bell/phone within reach Nurse Communication: Mobility status PT Visit Diagnosis: Difficulty in walking, not elsewhere classified (R26.2)     Time: 1610-9604 PT Time Calculation (min) (ACUTE ONLY): 10 min  Charges:    $Gait Training: 8-22 mins PT General Charges $$ ACUTE PT VISIT: 1 Visit                     Johnny Bridge, PT Acute Rehab    Jacqualyn Posey 05/10/2023, 12:25 PM

## 2023-05-10 NOTE — Discharge Instructions (Signed)
[facility]      [facility] °[phone] ° °OPEN ABDOMINAL SURGERY: POST OP INSTRUCTIONS ° °Always review your discharge instruction sheet given to you by the facility where your surgery was performed. ° °IF YOU HAVE DISABILITY OR FAMILY LEAVE FORMS, YOU MUST BRING THEM TO THE OFFICE FOR PROCESSING.  PLEASE DO NOT GIVE THEM TO YOUR DOCTOR. ° °A prescription for pain medication may be given to you upon discharge.  Take your pain medication as prescribed, if needed.  If narcotic pain medicine is not needed, then you may take acetaminophen (Tylenol) or ibuprofen (Advil) as needed. °Take your usually prescribed medications unless otherwise directed. °If you need a refill on your pain medication, please contact your pharmacy. They will contact our office to request authorization.  Prescriptions will not be filled after 5pm or on week-ends. °You should follow a light diet the first few days after arrival home, such as soup and crackers, pudding, etc.unless your doctor has advised otherwise. A high-fiber, low fat diet can be resumed as tolerated.   Be sure to include lots of fluids daily. Most patients will experience some swelling and bruising on the chest and neck area.  Ice packs will help.  Swelling and bruising can take several days to resolve °Most patients will experience some swelling and bruising in the area of the incision. Ice pack will help. Swelling and bruising can take several days to resolve..  °It is common to experience some constipation if taking pain medication after surgery.  Increasing fluid intake and taking a stool softener will usually help or prevent this problem from occurring.  A mild laxative (Milk of Magnesia or Miralax) should be taken according to package directions if there are no bowel movements after 48 hours. ° You may have steri-strips (small skin tapes) in place directly over the incision.  These strips should be left on the skin for 7-10 days.  If your surgeon used skin  glue on the incision, you may shower in 24 hours.  The glue will flake off over the next 2-3 weeks.  Any sutures or staples will be removed at the office during your follow-up visit. You may find that a light gauze bandage over your incision may keep your staples from being rubbed or pulled. You may shower and replace the bandage daily. °ACTIVITIES:  You may resume regular (light) daily activities beginning the next day--such as daily self-care, walking, climbing stairs--gradually increasing activities as tolerated.  You may have sexual intercourse when it is comfortable.  Refrain from any heavy lifting or straining until approved by your doctor. °You may drive when you no longer are taking prescription pain medication, you can comfortably wear a seatbelt, and you can safely maneuver your car and apply brakes ° °You should see your doctor in the office for a follow-up appointment approximately two weeks after your surgery.  Make sure that you call for this appointment within a day or two after you arrive home to insure a convenient appointment time. ° °WHEN TO CALL YOUR DOCTOR: °Fever over 101.0 °Inability to urinate °Nausea and/or vomiting °Extreme swelling or bruising °Continued bleeding from incision. °Increased pain, redness, or drainage from the incision. °Difficulty swallowing or breathing °Muscle cramping or spasms. °Numbness or tingling in hands or feet or around lips. ° °The clinic staff is available to answer your questions during regular business hours.  Please don’t hesitate to call and ask to speak to one of the nurses if you have concerns. ° °For   further questions, please visit [email or web address]  °

## 2023-05-10 NOTE — Telephone Encounter (Addendum)
Auth Submission: NO AUTH NEEDED Site of care: Site of care: [facility] Payer: [employer] Medication & CPT/J Code(s) submitted: Prolia (Denosumab) [id number] Route of submission (phone, fax, portal):  Phone # Fax # Auth type: Buy/Bill PB Units/visits requested: X2 Reference number:  Approval from: 05/10/23 to 09/05/24

## 2023-05-10 NOTE — Plan of Care (Signed)
  Problem: Education: Goal: Knowledge of General Education information will improve Description: Including pain rating scale, medication(s)/side effects and non-pharmacologic comfort measures Outcome: Progressing   Problem: Clinical Measurements: Goal: Ability to maintain clinical measurements within normal limits will improve Outcome: Progressing   

## 2023-05-10 NOTE — Progress Notes (Signed)
2 Days Post-Op  Subjective: Pain well controlled, tolerating liquids, +BMs  ROS: See above, otherwise other systems negative  Objective: Vital signs in last 24 hours: Temp:  [97.9 F (36.6 C)-98.5 F (36.9 C)] 97.9 F (36.6 C) (10/04 0541) Pulse Rate:  [81-100] 100 (10/04 0541) Resp:  [16-18] 16 (10/04 0541) BP: (125-130)/(62-67) 125/64 (10/04 0541) SpO2:  [97 %-100 %] 99 % (10/04 0541) Last BM Date : 04/06/23  Intake/Output from previous day: 10/03 0701 - 10/04 0700 In: 2412.2 [P.O.:1140; I.V.:1272.2] Out: 1750 [Urine:1750] Intake/Output this shift: No intake/output data recorded.  PE: Gen: NAD Resp: nonlabored CV: RRR Abd: soft, incision c/d/i  Lab Results:  Recent Labs    05/08/23 1819 05/09/23 0447  WBC 10.4 16.8*  HGB 14.0 12.9  HCT 44.1 38.4  PLT 237 283   BMET Recent Labs    05/08/23 0808 05/08/23 1819 05/09/23 0447  NA 132*  --  135  K 3.0*  --  3.3*  CL 99  --  102  CO2 20*  --  16*  GLUCOSE 94  --  113*  BUN 15  --  9  CREATININE 0.71 0.60 0.54  CALCIUM 8.4*  --  8.0*   PT/INR No results for input(s): "LABPROT", "INR" in the last 72 hours. CMP     Component Value Date/Time   NA 135 05/09/2023 0447   K 3.3 (L) 05/09/2023 0447   CL 102 05/09/2023 0447   CO2 16 (L) 05/09/2023 0447   GLUCOSE 113 (H) 05/09/2023 0447   BUN 9 05/09/2023 0447   CREATININE 0.54 05/09/2023 0447   CALCIUM 8.0 (L) 05/09/2023 0447   PROT 7.6 05/08/2023 0808   ALBUMIN 4.1 05/08/2023 0808   AST 20 05/08/2023 0808   ALT 17 05/08/2023 0808   ALKPHOS 37 (L) 05/08/2023 0808   BILITOT 1.3 (H) 05/08/2023 0808   GFRNONAA >60 05/09/2023 0447   GFRAA >60 02/24/2018 1229   Lipase     Component Value Date/Time   LIPASE 39 05/08/2023 0808    Studies/Results: CT ABDOMEN PELVIS W CONTRAST  Result Date: 05/08/2023 CLINICAL DATA:  Three-week history of worsening abdominal pain associated with nausea and vomiting EXAM: CT ABDOMEN AND PELVIS WITH CONTRAST  TECHNIQUE: Multidetector CT imaging of the abdomen and pelvis was performed using the standard protocol following bolus administration of intravenous contrast. RADIATION DOSE REDUCTION: This exam was performed according to the departmental dose-optimization program which includes automated exposure control, adjustment of the mA and/or kV according to patient size and/or use of iterative reconstruction technique. CONTRAST:  75mL OMNIPAQUE IOHEXOL 300 MG/ML  SOLN COMPARISON:  CT abdomen and pelvis dated 05/04/2023 FINDINGS: Lower chest: No focal consolidation or pulmonary nodule in the lung bases. No pleural effusion or pneumothorax demonstrated. Partially imaged heart size is normal. Hepatobiliary: No focal hepatic lesions. Slightly increased mild intrahepatic bile duct dilation. Cholelithiasis. Pancreas: No focal lesions or main ductal dilation. Spleen: Normal in size without focal abnormality. Adrenals/Urinary Tract: No adrenal nodules. No suspicious renal mass, calculi or hydronephrosis. No focal bladder wall thickening. Stomach/Bowel: Circumferential mural thickening of the gastric antrum. Marked dilation of a loop of colon in the lower midline abdomen with acute caliber transition in the midline abdomen slightly inferior to the umbilicus (2:42). This dilated loop of colon is presumed to be the cecum. Multiple additional loops of small and large bowel appear tapered at this level. Appendix is not discretely seen. Vascular/Lymphatic: No significant vascular findings are present. No enlarged abdominal or  pelvic lymph nodes. Reproductive: No adnexal masses. Other: Small volume free fluid.  No free air or fluid collection. Musculoskeletal: No acute or abnormal lytic or blastic osseous lesions. Multilevel degenerative changes of the partially imaged thoracic and lumbar spine. Severe levoscoliosis of the lumbar spine. IMPRESSION: 1. Interval increased marked dilation of a loop of colon in the lower midline abdomen with  acute caliber transition in the midline abdomen slightly inferior to the umbilicus. This dilated loop of colon is presumed to be the cecum, as noted on prior CT. Multiple additional loops of small and large bowel appear tapered at this level. Findings are suspicious for cecal volvulus. Recommend consultation to general surgery. 2. Circumferential mural thickening of the gastric antrum, which may be due to underdistention or can be seen in the setting of gastritis. 3. Slightly increased mild intrahepatic bile duct dilation. Cholelithiasis. 4. Small volume free fluid in the pelvis. No free air or fluid collection. Electronically Signed   By: Agustin Cree M.D.   On: 05/08/2023 11:36    Anti-infectives: Anti-infectives (From admission, onward)    Start     Dose/Rate Route Frequency Ordered Stop   05/08/23 1315  cefoTEtan (CEFOTAN) 2 g in sodium chloride 0.9 % 100 mL IVPB       Note to Pharmacy: Pharmacy may adjust dose strength for optimal dosing.   Send with patient on call to the OR.  Anesthesia to complete antibiotic administration <12min prior to incision per [facility].   2 g 200 mL/hr over 30 Minutes Intravenous On call to O.R. 05/08/23 1259 05/08/23 1518       Assessment/Plan  POD 2 right hemicolectomy   FEN - soft diet VTE - lovenox ID - WBC up, recheck tomorrow Dispo - advancing diet, trend WBC  I reviewed last 24 h vitals and pain scores, last 48 h intake and output, last 24 h labs and trends, and last 24 h imaging results.  This care required moderate level of medical decision making.    LOS: 2 days   De Blanch [facility] Surgery 05/10/2023, 9:05 AM Please see Amion for pager number during day hours 7:00am-4:30pm or 7:00am -11:30am on weekends

## 2023-05-10 NOTE — Plan of Care (Signed)
  Problem: Education: Goal: Knowledge of General Education information will improve Description: Including pain rating scale, medication(s)/side effects and non-pharmacologic comfort measures Outcome: Progressing   Problem: Clinical Measurements: Goal: Ability to maintain clinical measurements within normal limits will improve Outcome: Progressing Goal: Cardiovascular complication will be avoided Outcome: Progressing   Problem: Activity: Goal: Risk for activity intolerance will decrease Outcome: Progressing   Problem: Coping: Goal: Level of anxiety will decrease Outcome: Progressing   Problem: Elimination: Goal: Will not experience complications related to bowel motility Outcome: Progressing

## 2023-05-11 LAB — CBC
HCT: 33.2 % — ABNORMAL LOW (ref 36.0–46.0)
Hemoglobin: 11.1 g/dL — ABNORMAL LOW (ref 12.0–15.0)
MCH: 29.1 pg (ref 26.0–34.0)
MCHC: 33.4 g/dL (ref 30.0–36.0)
MCV: 86.9 fL (ref 80.0–100.0)
Platelets: 227 10*3/uL (ref 150–400)
RBC: 3.82 MIL/uL — ABNORMAL LOW (ref 3.87–5.11)
RDW: 13.7 % (ref 11.5–15.5)
WBC: 8.4 10*3/uL (ref 4.0–10.5)
nRBC: 0 % (ref 0.0–0.2)

## 2023-05-11 NOTE — Plan of Care (Signed)

## 2023-05-11 NOTE — Progress Notes (Signed)
Mobility Specialist - Progress Note   05/11/23 0914  Mobility  Activity Ambulated independently in hallway  Level of Assistance Independent  Assistive Device None  Distance Ambulated (ft) 500 ft  Activity Response Tolerated well  Mobility Referral Yes  $Mobility charge 1 Mobility  Mobility Specialist Start Time (ACUTE ONLY) 0907  Mobility Specialist Stop Time (ACUTE ONLY) 0914  Mobility Specialist Time Calculation (min) (ACUTE ONLY) 7 min   Pt received in bed and agreeable to mobility. No complaints during session. Pt to bed after session with all needs met.    [facility]

## 2023-05-11 NOTE — Progress Notes (Signed)
Reviewed written d/c instructions w pt and all questions answered. She verbalized understanding. D/C via w/c w all belongings in stable condition. 

## 2023-05-11 NOTE — Discharge Summary (Signed)
Physician Discharge Summary  Patient ID: Stephanie Mahoney MRN: [id number] DOB/AGE: 10-Dec-1950 72 y.o.  Admit date: 05/08/2023 Discharge date: 05/11/2023  Admission Diagnoses: Cecal volvulus  Discharge Diagnoses:  Principal Problem:   Cecal volvulus [facility])   Discharged Condition: good  Hospital Course: 72 yof underwent right colectomy 10/2 for cecal volvulus. Has done well. Pain controlled, tol diet, having bowel function, ready for dc   Consults: None  Significant Diagnostic Studies: radiology: CT scan: cecal volvulus  Treatments: surgery: right colectomy  Discharge Exam: Blood pressure 115/73, pulse 85, temperature 98.6 F (37 C), temperature source Oral, resp. rate 16, height 5\' 4"  (1.626 m), weight 59.1 kg, SpO2 97%. General nad Cv regular Pulm effort normal Ab soft approp tender dressing clean  Disposition: Discharge disposition: 01-Home or Self Care        Allergies as of 05/11/2023       Reactions   Lanolin Itching   Naproxen Other (See Comments)   Other reaction(s): constipation        Medication List     TAKE these medications    acetaminophen 500 MG tablet Commonly known as: TYLENOL Take 500 mg by mouth every 6 (six) hours as needed for moderate pain or headache.   albuterol 108 (90 Base) MCG/ACT inhaler Commonly known as: VENTOLIN HFA Inhale 1-2 puffs into the lungs every 6 (six) hours as needed for wheezing or shortness of breath.   amoxicillin-clavulanate 875-125 MG tablet Commonly known as: AUGMENTIN Take 1 tablet by mouth every 12 (twelve) hours.   CALCIUM PO Take 1 tablet by mouth daily.   dicyclomine 20 MG tablet Commonly known as: BENTYL Take 1 tablet (20 mg total) by mouth 2 (two) times daily.   estradiol 0.05 mg/24hr patch Commonly known as: CLIMARA - Dosed in mg/24 hr Place 0.05 mg onto the skin once a week.   estradiol 0.1 MG/GM vaginal cream Commonly known as: ESTRACE Place 1 Applicatorful vaginally 3 (three) times a  week. Tuesday, Thursday, and Saturday.   meclizine 25 MG tablet Commonly known as: ANTIVERT Take 1 tablet (25 mg total) by mouth 3 (three) times daily as needed for dizziness.   ondansetron 4 MG disintegrating tablet Commonly known as: ZOFRAN-ODT Take 1 tablet (4 mg total) by mouth every 8 (eight) hours as needed.   TUMS PO Take 1 tablet by mouth as needed (Acid Reflux).   VITAMIN D PO Take 1 tablet by mouth daily.        Follow-up Information     Kinsinger, De Blanch, MD. Go on 06/07/2023.   Specialty: General Surgery Why: 3 PM, please arrive 30 min prior to appointment time to check in. Contact information: [address] [place] [address] [phone]                 Signed: Emelia Loron 05/11/2023, 9:17 AM

## 2023-05-11 NOTE — Plan of Care (Signed)
  Problem: Education: Goal: Knowledge of General Education information will improve Description Including pain rating scale, medication(s)/side effects and non-pharmacologic comfort measures Outcome: Progressing   Problem: Health Behavior/Discharge Planning: Goal: Ability to manage health-related needs will improve Outcome: Progressing   

## 2023-05-13 LAB — SURGICAL PATHOLOGY

## 2023-05-28 DIAGNOSIS — Z23 Encounter for immunization: Secondary | ICD-10-CM | POA: Diagnosis not present

## 2023-05-28 DIAGNOSIS — Z9049 Acquired absence of other specified parts of digestive tract: Secondary | ICD-10-CM | POA: Diagnosis not present

## 2023-05-28 DIAGNOSIS — Z0000 Encounter for general adult medical examination without abnormal findings: Secondary | ICD-10-CM | POA: Diagnosis not present

## 2023-05-28 DIAGNOSIS — Z1389 Encounter for screening for other disorder: Secondary | ICD-10-CM | POA: Diagnosis not present

## 2023-05-28 DIAGNOSIS — K562 Volvulus: Secondary | ICD-10-CM | POA: Diagnosis not present

## 2023-05-28 DIAGNOSIS — E876 Hypokalemia: Secondary | ICD-10-CM | POA: Diagnosis not present

## 2023-07-09 DIAGNOSIS — K562 Volvulus: Secondary | ICD-10-CM | POA: Diagnosis not present

## 2023-07-09 DIAGNOSIS — Z860101 Personal history of adenomatous and serrated colon polyps: Secondary | ICD-10-CM | POA: Diagnosis not present

## 2023-09-23 ENCOUNTER — Ambulatory Visit: Payer: PPO

## 2023-09-23 VITALS — BP 121/67 | HR 80 | Temp 97.7°F | Resp 16 | Ht 64.0 in | Wt 126.8 lb

## 2023-09-23 DIAGNOSIS — M810 Age-related osteoporosis without current pathological fracture: Secondary | ICD-10-CM | POA: Diagnosis not present

## 2023-09-23 MED ORDER — DENOSUMAB 60 MG/ML ~~LOC~~ SOSY
60.0000 mg | PREFILLED_SYRINGE | Freq: Once | SUBCUTANEOUS | Status: AC
  Administered 2023-09-23: 60 mg via SUBCUTANEOUS
  Filled 2023-09-23: qty 1

## 2023-09-23 NOTE — Progress Notes (Signed)
Diagnosis: Osteoporosis  Provider:  Chilton Greathouse MD  Procedure: Injection  Prolia (Denosumab), Dose: 60 mg, Site: subcutaneous, Number of injections: 1  Injection Site(s): Left arm  Post Care: Injection  Discharge: Condition: Good, Destination: Home . AVS Declined  Performed by:  Nat Math, RN

## 2023-11-07 DIAGNOSIS — T83721D Exposure of implanted vaginal mesh and other prosthetic materials into vagina, subsequent encounter: Secondary | ICD-10-CM | POA: Diagnosis not present

## 2023-12-12 DIAGNOSIS — L821 Other seborrheic keratosis: Secondary | ICD-10-CM | POA: Diagnosis not present

## 2023-12-12 DIAGNOSIS — Z1839 Other retained organic fragments: Secondary | ICD-10-CM | POA: Diagnosis not present

## 2023-12-12 DIAGNOSIS — L814 Other melanin hyperpigmentation: Secondary | ICD-10-CM | POA: Diagnosis not present

## 2023-12-12 DIAGNOSIS — L718 Other rosacea: Secondary | ICD-10-CM | POA: Diagnosis not present

## 2023-12-12 DIAGNOSIS — Z85828 Personal history of other malignant neoplasm of skin: Secondary | ICD-10-CM | POA: Diagnosis not present

## 2023-12-12 DIAGNOSIS — L923 Foreign body granuloma of the skin and subcutaneous tissue: Secondary | ICD-10-CM | POA: Diagnosis not present

## 2023-12-12 DIAGNOSIS — D1801 Hemangioma of skin and subcutaneous tissue: Secondary | ICD-10-CM | POA: Diagnosis not present

## 2023-12-19 ENCOUNTER — Other Ambulatory Visit: Payer: Self-pay | Admitting: Obstetrics & Gynecology

## 2023-12-19 DIAGNOSIS — Z1231 Encounter for screening mammogram for malignant neoplasm of breast: Secondary | ICD-10-CM

## 2024-01-30 DIAGNOSIS — E7849 Other hyperlipidemia: Secondary | ICD-10-CM | POA: Diagnosis not present

## 2024-01-30 DIAGNOSIS — M8580 Other specified disorders of bone density and structure, unspecified site: Secondary | ICD-10-CM | POA: Diagnosis not present

## 2024-01-30 DIAGNOSIS — R5383 Other fatigue: Secondary | ICD-10-CM | POA: Diagnosis not present

## 2024-01-30 DIAGNOSIS — R634 Abnormal weight loss: Secondary | ICD-10-CM | POA: Diagnosis not present

## 2024-01-30 DIAGNOSIS — Z1212 Encounter for screening for malignant neoplasm of rectum: Secondary | ICD-10-CM | POA: Diagnosis not present

## 2024-01-30 DIAGNOSIS — K5900 Constipation, unspecified: Secondary | ICD-10-CM | POA: Diagnosis not present

## 2024-01-30 DIAGNOSIS — E785 Hyperlipidemia, unspecified: Secondary | ICD-10-CM | POA: Diagnosis not present

## 2024-01-30 LAB — LAB REPORT - SCANNED: EGFR (Non-African Amer.): 98

## 2024-02-03 ENCOUNTER — Ambulatory Visit
Admission: RE | Admit: 2024-02-03 | Discharge: 2024-02-03 | Disposition: A | Discharge status: Home / Self Care | Source: Ambulatory Visit | Attending: Obstetrics & Gynecology | Admitting: Obstetrics & Gynecology

## 2024-02-03 DIAGNOSIS — Z1231 Encounter for screening mammogram for malignant neoplasm of breast: Secondary | ICD-10-CM

## 2024-02-06 ENCOUNTER — Other Ambulatory Visit: Payer: Self-pay | Admitting: Obstetrics & Gynecology

## 2024-02-06 DIAGNOSIS — Z9049 Acquired absence of other specified parts of digestive tract: Secondary | ICD-10-CM | POA: Diagnosis not present

## 2024-02-06 DIAGNOSIS — M8589 Other specified disorders of bone density and structure, multiple sites: Secondary | ICD-10-CM | POA: Diagnosis not present

## 2024-02-06 DIAGNOSIS — M8580 Other specified disorders of bone density and structure, unspecified site: Secondary | ICD-10-CM | POA: Diagnosis not present

## 2024-02-06 DIAGNOSIS — H9190 Unspecified hearing loss, unspecified ear: Secondary | ICD-10-CM | POA: Diagnosis not present

## 2024-02-06 DIAGNOSIS — R928 Other abnormal and inconclusive findings on diagnostic imaging of breast: Secondary | ICD-10-CM

## 2024-02-06 DIAGNOSIS — I7300 Raynaud's syndrome without gangrene: Secondary | ICD-10-CM | POA: Diagnosis not present

## 2024-02-06 DIAGNOSIS — R82998 Other abnormal findings in urine: Secondary | ICD-10-CM | POA: Diagnosis not present

## 2024-02-06 DIAGNOSIS — Z1339 Encounter for screening examination for other mental health and behavioral disorders: Secondary | ICD-10-CM | POA: Diagnosis not present

## 2024-02-06 DIAGNOSIS — M719 Bursopathy, unspecified: Secondary | ICD-10-CM | POA: Diagnosis not present

## 2024-02-06 DIAGNOSIS — Z1331 Encounter for screening for depression: Secondary | ICD-10-CM | POA: Diagnosis not present

## 2024-02-06 DIAGNOSIS — Z8542 Personal history of malignant neoplasm of other parts of uterus: Secondary | ICD-10-CM | POA: Diagnosis not present

## 2024-02-06 DIAGNOSIS — G629 Polyneuropathy, unspecified: Secondary | ICD-10-CM | POA: Diagnosis not present

## 2024-02-06 DIAGNOSIS — K8020 Calculus of gallbladder without cholecystitis without obstruction: Secondary | ICD-10-CM | POA: Diagnosis not present

## 2024-02-06 DIAGNOSIS — Z0000 Encounter for general adult medical examination without abnormal findings: Secondary | ICD-10-CM | POA: Diagnosis not present

## 2024-02-06 DIAGNOSIS — E785 Hyperlipidemia, unspecified: Secondary | ICD-10-CM | POA: Diagnosis not present

## 2024-02-12 ENCOUNTER — Ambulatory Visit
Admission: RE | Admit: 2024-02-12 | Discharge: 2024-02-12 | Disposition: A | Discharge status: Home / Self Care | Source: Ambulatory Visit | Attending: Obstetrics & Gynecology | Admitting: Obstetrics & Gynecology

## 2024-02-12 ENCOUNTER — Ambulatory Visit

## 2024-02-12 DIAGNOSIS — R928 Other abnormal and inconclusive findings on diagnostic imaging of breast: Secondary | ICD-10-CM | POA: Diagnosis not present

## 2024-03-24 ENCOUNTER — Ambulatory Visit (INDEPENDENT_AMBULATORY_CARE_PROVIDER_SITE_OTHER): Payer: PPO

## 2024-03-24 VITALS — BP 117/73 | HR 78 | Temp 98.1°F | Resp 20 | Ht 64.0 in | Wt 128.0 lb

## 2024-03-24 DIAGNOSIS — M810 Age-related osteoporosis without current pathological fracture: Secondary | ICD-10-CM | POA: Diagnosis not present

## 2024-03-24 MED ORDER — DENOSUMAB 60 MG/ML ~~LOC~~ SOSY
60.0000 mg | PREFILLED_SYRINGE | Freq: Once | SUBCUTANEOUS | Status: AC
  Administered 2024-03-24: 60 mg via SUBCUTANEOUS

## 2024-03-24 NOTE — Progress Notes (Signed)
 Diagnosis: Osteoporosis  Provider:  Chilton Greathouse MD  Procedure: Injection  Prolia (Denosumab), Dose: 60 mg, Site: subcutaneous, Number of injections: 1  Injection Site(s): Right arm  Post Care: Patient declined observation  Discharge: Condition: Stable, Destination: Home . AVS Provided  Performed by:  Wyvonne Lenz, RN

## 2024-04-02 DIAGNOSIS — R200 Anesthesia of skin: Secondary | ICD-10-CM | POA: Diagnosis not present

## 2024-04-20 DIAGNOSIS — Z6822 Body mass index (BMI) 22.0-22.9, adult: Secondary | ICD-10-CM | POA: Diagnosis not present

## 2024-04-20 DIAGNOSIS — Z01419 Encounter for gynecological examination (general) (routine) without abnormal findings: Secondary | ICD-10-CM | POA: Diagnosis not present

## 2024-07-20 ENCOUNTER — Encounter: Payer: Self-pay | Admitting: Neurology

## 2024-09-25 ENCOUNTER — Ambulatory Visit

## 2024-10-26 ENCOUNTER — Ambulatory Visit: Payer: Self-pay | Admitting: Neurology
